# Patient Record
Sex: Female | Born: 1950 | Race: White | Hispanic: No | Marital: Single | Smoking: Never smoker
Health system: Southern US, Community
[De-identification: ages and names within clinical notes are randomized; demographics above are authoritative.]

## PROBLEM LIST (undated history)

## (undated) DIAGNOSIS — F32A Depression, unspecified: Secondary | ICD-10-CM

## (undated) DIAGNOSIS — K219 Gastro-esophageal reflux disease without esophagitis: Secondary | ICD-10-CM

## (undated) DIAGNOSIS — R011 Cardiac murmur, unspecified: Secondary | ICD-10-CM

## (undated) DIAGNOSIS — F419 Anxiety disorder, unspecified: Secondary | ICD-10-CM

## (undated) DIAGNOSIS — D510 Vitamin B12 deficiency anemia due to intrinsic factor deficiency: Secondary | ICD-10-CM

## (undated) DIAGNOSIS — I10 Essential (primary) hypertension: Secondary | ICD-10-CM

## (undated) DIAGNOSIS — M549 Dorsalgia, unspecified: Secondary | ICD-10-CM

## (undated) DIAGNOSIS — E785 Hyperlipidemia, unspecified: Secondary | ICD-10-CM

## (undated) DIAGNOSIS — H269 Unspecified cataract: Secondary | ICD-10-CM

## (undated) DIAGNOSIS — M1990 Unspecified osteoarthritis, unspecified site: Secondary | ICD-10-CM

## (undated) HISTORY — DX: Anxiety disorder, unspecified: F41.9

## (undated) HISTORY — DX: Dorsalgia, unspecified: M54.9

## (undated) HISTORY — DX: Depression, unspecified: F32.A

## (undated) HISTORY — DX: Gastro-esophageal reflux disease without esophagitis: K21.9

## (undated) HISTORY — PX: TONSILLECTOMY: SUR1361

## (undated) HISTORY — DX: Unspecified cataract: H26.9

## (undated) HISTORY — DX: Unspecified osteoarthritis, unspecified site: M19.90

## (undated) HISTORY — PX: HEMORRHOID BANDING: SHX5850

## (undated) HISTORY — PX: APPENDECTOMY: SHX54

## (undated) HISTORY — DX: Cardiac murmur, unspecified: R01.1

## (undated) HISTORY — PX: WISDOM TOOTH EXTRACTION: SHX21

---

## 1998-07-09 ENCOUNTER — Other Ambulatory Visit: Admission: RE | Admit: 1998-07-09 | Discharge: 1998-07-09 | Payer: Self-pay | Admitting: Obstetrics and Gynecology

## 1999-10-14 ENCOUNTER — Other Ambulatory Visit: Admission: RE | Admit: 1999-10-14 | Discharge: 1999-10-14 | Payer: Self-pay | Admitting: Gynecology

## 1999-11-10 ENCOUNTER — Ambulatory Visit (HOSPITAL_COMMUNITY): Admission: RE | Admit: 1999-11-10 | Discharge: 1999-11-10 | Payer: Self-pay | Admitting: Gynecology

## 2001-04-20 ENCOUNTER — Other Ambulatory Visit: Admission: RE | Admit: 2001-04-20 | Discharge: 2001-04-20 | Payer: Self-pay | Admitting: Gynecology

## 2005-09-28 ENCOUNTER — Other Ambulatory Visit: Admission: RE | Admit: 2005-09-28 | Discharge: 2005-09-28 | Payer: Self-pay | Admitting: Gynecology

## 2006-10-01 ENCOUNTER — Other Ambulatory Visit: Admission: RE | Admit: 2006-10-01 | Discharge: 2006-10-01 | Payer: Self-pay | Admitting: Obstetrics and Gynecology

## 2009-03-08 ENCOUNTER — Other Ambulatory Visit: Admission: RE | Admit: 2009-03-08 | Discharge: 2009-03-08 | Payer: Self-pay | Admitting: Obstetrics and Gynecology

## 2009-03-08 ENCOUNTER — Ambulatory Visit: Payer: Self-pay | Admitting: Women's Health

## 2010-07-02 ENCOUNTER — Encounter: Payer: Self-pay | Admitting: Women's Health

## 2011-10-28 ENCOUNTER — Emergency Department (HOSPITAL_COMMUNITY)
Admission: EM | Admit: 2011-10-28 | Discharge: 2011-10-29 | Disposition: A | Discharge status: Home / Self Care | Payer: BC Managed Care – PPO | Attending: Emergency Medicine | Admitting: Emergency Medicine

## 2011-10-28 ENCOUNTER — Encounter (HOSPITAL_COMMUNITY): Payer: Self-pay | Admitting: Emergency Medicine

## 2011-10-28 DIAGNOSIS — S31809A Unspecified open wound of unspecified buttock, initial encounter: Secondary | ICD-10-CM | POA: Insufficient documentation

## 2011-10-28 DIAGNOSIS — T148XXA Other injury of unspecified body region, initial encounter: Secondary | ICD-10-CM

## 2011-10-28 DIAGNOSIS — W460XXA Contact with hypodermic needle, initial encounter: Secondary | ICD-10-CM | POA: Insufficient documentation

## 2011-10-28 HISTORY — DX: Vitamin B12 deficiency anemia due to intrinsic factor deficiency: D51.0

## 2011-10-28 HISTORY — DX: Hyperlipidemia, unspecified: E78.5

## 2011-10-28 NOTE — ED Notes (Signed)
Pt presents w/ needle lodged in RUOQ of right buttock from self administration of B12 shot earlier tonight, needle broke from syringe

## 2011-10-29 NOTE — ED Notes (Signed)
Discharge instructions reviewed w/ pt., verbalizes understanding. No prescriptions proivded at discharge.

## 2011-10-29 NOTE — ED Provider Notes (Signed)
Medical screening examination/treatment/procedure(s) were performed by non-physician practitioner and as supervising physician I was immediately available for consultation/collaboration.   Hanley Seamen, MD 10/29/11 [phone]

## 2011-10-29 NOTE — ED Provider Notes (Signed)
History     CSN: [id number]  Arrival date & time 10/28/11  2219   First MD Initiated Contact with Patient 10/28/11 2326      Chief Complaint  Patient presents with  . Foreign Body in Skin    (Consider location/radiation/quality/duration/timing/severity/associated sxs/prior treatment) HPI Comments: Patient presents today with the chief complaint of a needle getting stuck in her right thigh/buttock. She was giving herself her B12 injection in her right buttock and when she pulled the needle out she realized it was no longer attached to the syringe. She did not see the needle on the floor or hear it drop. She reports no pain even with palpation. She has never had anything like this happen before. Onset acute. Course constant. Nothing makes symptoms better or worse.   The history is provided by the patient.    Past Medical History  Diagnosis Date  . Pernicious anemia   . Hyperlipidemia     Past Surgical History  Procedure Date  . Tonsillectomy   . Appendectomy     No family history on file.  History  Substance Use Topics  . Smoking status: Never Smoker   . Smokeless tobacco: Never Used  . Alcohol Use: Yes     ocassional wine    OB History    Grav Para Term Preterm Abortions TAB SAB Ect Mult Living                  Review of Systems  Musculoskeletal:       No pain in her right buttock  Skin: Positive for wound (puncture). Negative for color change.  Neurological: Negative for weakness and numbness.    Allergies  Review of patient's allergies indicates no known allergies.  Home Medications   Current Outpatient Rx  Name Route Sig Dispense Refill  . B COMPLEX-C PO TABS Oral Take 1 tablet by mouth daily.    Marland Kitchen CALCIUM CARBONATE 1250 MG PO TABS Oral Take 2 tablets by mouth daily.    Marland Kitchen VITAMIN D 1000 UNITS PO TABS Oral Take 2,000 Units by mouth daily.    Marland Kitchen FLUOXETINE HCL 10 MG PO TABS Oral Take 10 mg by mouth daily.    Marland Kitchen OVER THE COUNTER MEDICATION Oral Take 1  tablet by mouth daily. Plant based to lower chlolesterol    . OVER THE COUNTER MEDICATION Oral Take by mouth daily. tumeric    . OVER THE COUNTER MEDICATION Oral Take by mouth daily. Emercency=1 packet per day    . OVER THE COUNTER MEDICATION  daily. Cartiledge=for joint pain    . SIMVASTATIN 10 MG PO TABS Oral Take 10 mg by mouth at bedtime.    Marland Kitchen VITAMIN E 400 UNITS PO CAPS Oral Take 400 Units by mouth daily.      BP 149/69  Pulse 74  Temp 98.4 F (36.9 C) (Oral)  SpO2 100%  Physical Exam  Nursing note and vitals reviewed. Constitutional: She appears well-developed and well-nourished.       In no acute distress  HENT:  Head: Normocephalic and atraumatic.  Eyes: Conjunctivae normal are normal.  Neck: Normal range of motion.  Pulmonary/Chest: No respiratory distress.  Neurological: She is alert.  Skin: Skin is warm and dry.       Small puncture noted at site of injection of R lateral buttock. No resistance with palpation of area. No visible needle or metal.   Psychiatric: She has a normal mood and affect.    ED Course  Procedures (including critical care time)  Labs Reviewed - No data to display No results found.   1. Puncture wound     12:15 AM Patient seen and examined.    Vital signs reviewed and are as follows: Filed Vitals:   10/28/11 2309  BP: 149/69  Pulse: 74  Temp: 98.4 F (36.9 C)   Ultrasound was used on the area and no metallic shadow was seen. Palpation of the area did not reveal any resistance suggestive of needle in skin. Syringe was disassembled and the complete needle was found pushed into the syringe behind a spring. Patient confirmed that this is the needle that she was concerned about.  Patient was concerned about not getting a complete dose of her B12 shot. I urged patient to call her primary care physician tomorrow regarding this.   MDM  Patient presents with concern for needle buried within her skin after self administration of the B12  injection. Needle was found within the syringe itself. Patient does not have foreign body.       Renne Crigler, [place] 10/29/11 [phone]

## 2012-02-03 ENCOUNTER — Ambulatory Visit: Payer: BC Managed Care – PPO | Admitting: Internal Medicine

## 2012-07-21 ENCOUNTER — Other Ambulatory Visit: Payer: Self-pay | Admitting: Family Medicine

## 2012-07-21 DIAGNOSIS — R748 Abnormal levels of other serum enzymes: Secondary | ICD-10-CM

## 2012-07-28 ENCOUNTER — Ambulatory Visit
Admission: RE | Admit: 2012-07-28 | Discharge: 2012-07-28 | Disposition: A | Discharge status: Home / Self Care | Payer: BC Managed Care – PPO | Source: Ambulatory Visit | Attending: Family Medicine | Admitting: Family Medicine

## 2012-07-28 DIAGNOSIS — R748 Abnormal levels of other serum enzymes: Secondary | ICD-10-CM

## 2012-10-10 ENCOUNTER — Ambulatory Visit: Payer: BC Managed Care – PPO | Admitting: Cardiovascular Disease

## 2013-01-31 ENCOUNTER — Emergency Department (HOSPITAL_BASED_OUTPATIENT_CLINIC_OR_DEPARTMENT_OTHER)
Admission: EM | Admit: 2013-01-31 | Discharge: 2013-01-31 | Disposition: A | Discharge status: Home / Self Care | Payer: BC Managed Care – PPO | Attending: Emergency Medicine | Admitting: Emergency Medicine

## 2013-01-31 ENCOUNTER — Encounter (HOSPITAL_BASED_OUTPATIENT_CLINIC_OR_DEPARTMENT_OTHER): Payer: Self-pay | Admitting: Emergency Medicine

## 2013-01-31 DIAGNOSIS — W1809XA Striking against other object with subsequent fall, initial encounter: Secondary | ICD-10-CM | POA: Insufficient documentation

## 2013-01-31 DIAGNOSIS — Y929 Unspecified place or not applicable: Secondary | ICD-10-CM | POA: Insufficient documentation

## 2013-01-31 DIAGNOSIS — E785 Hyperlipidemia, unspecified: Secondary | ICD-10-CM | POA: Insufficient documentation

## 2013-01-31 DIAGNOSIS — Z23 Encounter for immunization: Secondary | ICD-10-CM | POA: Insufficient documentation

## 2013-01-31 DIAGNOSIS — Z862 Personal history of diseases of the blood and blood-forming organs and certain disorders involving the immune mechanism: Secondary | ICD-10-CM | POA: Insufficient documentation

## 2013-01-31 DIAGNOSIS — S01501A Unspecified open wound of lip, initial encounter: Secondary | ICD-10-CM | POA: Insufficient documentation

## 2013-01-31 DIAGNOSIS — Y93K1 Activity, walking an animal: Secondary | ICD-10-CM | POA: Insufficient documentation

## 2013-01-31 DIAGNOSIS — Z79899 Other long term (current) drug therapy: Secondary | ICD-10-CM | POA: Insufficient documentation

## 2013-01-31 DIAGNOSIS — W010XXA Fall on same level from slipping, tripping and stumbling without subsequent striking against object, initial encounter: Secondary | ICD-10-CM | POA: Insufficient documentation

## 2013-01-31 DIAGNOSIS — S01511A Laceration without foreign body of lip, initial encounter: Secondary | ICD-10-CM

## 2013-01-31 MED ORDER — TETANUS-DIPHTH-ACELL PERTUSSIS 5-2.5-18.5 LF-MCG/0.5 IM SUSP
0.5000 mL | Freq: Once | INTRAMUSCULAR | Status: AC
  Administered 2013-01-31: 0.5 mL via INTRAMUSCULAR
  Filled 2013-01-31: qty 0.5

## 2013-01-31 MED ORDER — TETANUS-DIPHTH-ACELL PERTUSSIS 5-2.5-18.5 LF-MCG/0.5 IM SUSP
INTRAMUSCULAR | Status: AC
  Filled 2013-01-31: qty 0.5

## 2013-01-31 MED ORDER — PENICILLIN V POTASSIUM 500 MG PO TABS: 500.0000 mg | ORAL_TABLET | Freq: Four times a day (QID) | ORAL | Status: AC

## 2013-01-31 NOTE — ED Notes (Signed)
Pt amb to triage with quick steady gait in nad. Pt reports trip and fall while walking her dog just pta. Pt reports hitting her face on concrete, abrasions noted to face and mouth, denies any loc.

## 2013-01-31 NOTE — ED Provider Notes (Signed)
Medical screening examination/treatment/procedure(s) were performed by non-physician practitioner and as supervising physician I was immediately available for consultation/collaboration.  EKG Interpretation   None         Fradel Baldonado, MD 01/31/13 2252 

## 2013-01-31 NOTE — ED Provider Notes (Signed)
CSN: [id number]     Arrival date & time 01/31/13  1835 History   First MD Initiated Contact with Patient 01/31/13 1947     Chief Complaint  Patient presents with  . Fall   (Consider location/radiation/quality/duration/timing/severity/associated sxs/prior Treatment) Patient is a 62 y.o. female presenting with fall. The history is provided by the patient. No language interpreter was used.  Fall This is a new problem. The current episode started today. The problem occurs constantly. The problem has been unchanged. Nothing aggravates the symptoms. She has tried nothing for the symptoms.  Pt fell and hit face on concrete.  Pt complains of a cut in  her mouth.  No loss of consciousness.  No vomitting.  Pt complains of a slight headache  Past Medical History  Diagnosis Date  . Pernicious anemia   . Hyperlipidemia    Past Surgical History  Procedure Laterality Date  . Tonsillectomy    . Appendectomy     History reviewed. No pertinent family history. History  Substance Use Topics  . Smoking status: Never Smoker   . Smokeless tobacco: Never Used  . Alcohol Use: Yes     Comment: ocassional wine   OB History   Grav Para Term Preterm Abortions TAB SAB Ect Mult Living                 Review of Systems  All other systems reviewed and are negative.    Allergies  Review of patient's allergies indicates no known allergies.  Home Medications   Current Outpatient Rx  Name  Route  Sig  Dispense  Refill  . B Complex-C (B-COMPLEX WITH VITAMIN C) tablet   Oral   Take 1 tablet by mouth daily.         . calcium carbonate (CALCIUM 500) 1250 MG tablet   Oral   Take 2 tablets by mouth daily.         . cholecalciferol (VITAMIN D) 1000 UNITS tablet   Oral   Take 2,000 Units by mouth daily.         Marland Kitchen FLUoxetine (PROZAC) 10 MG tablet   Oral   Take 10 mg by mouth daily.         Marland Kitchen OVER THE COUNTER MEDICATION   Oral   Take 1 tablet by mouth daily. Plant based to lower  chlolesterol         . OVER THE COUNTER MEDICATION   Oral   Take by mouth daily. tumeric         . OVER THE COUNTER MEDICATION   Oral   Take by mouth daily. Emercency=1 packet per day         . OVER THE COUNTER MEDICATION      daily. Cartiledge=for joint pain         . penicillin v potassium (VEETID) 500 MG tablet   Oral   Take 1 tablet (500 mg total) by mouth 4 (four) times daily.   20 tablet   0   . simvastatin (ZOCOR) 10 MG tablet   Oral   Take 10 mg by mouth at bedtime.         . vitamin E 400 UNIT capsule   Oral   Take 400 Units by mouth daily.          BP 158/84  Pulse 74  Temp(Src) 99.1 F (37.3 C) (Oral)  Resp 18  Ht 5' 3.5" (1.613 m)  Wt 155 lb (70.308 kg)  BMI 27.02 kg/m2  SpO2 99% Physical Exam  Vitals reviewed. Constitutional: She is oriented to person, place, and time. She appears well-developed and well-nourished.  HENT:  Head: Normocephalic and atraumatic.  Right Ear: External ear normal.  Left Ear: External ear normal.  Laceration upper inner lip,   No laceration outside,  Superficial abrasions   Eyes: Conjunctivae are normal. Pupils are equal, round, and reactive to light.  Neck: Normal range of motion. Neck supple.  Cardiovascular: Normal rate and normal heart sounds.   Pulmonary/Chest: Effort normal.  Abdominal: Soft.  Neurological: She is alert and oriented to person, place, and time. She has normal reflexes.  Psychiatric: She has a normal mood and affect.    ED Course  Procedures (including critical care time) Labs Review Labs Reviewed - No data to display Imaging Review No results found.  EKG Interpretation   None       MDM   1. Laceration of lip, initial encounter    pcn vk 500mg  qid  Tylenol,  Tetanus updated    Elson Areas, PA-C 01/31/13 2122

## 2013-11-23 DIAGNOSIS — Z23 Encounter for immunization: Secondary | ICD-10-CM | POA: Diagnosis not present

## 2014-01-18 DIAGNOSIS — M549 Dorsalgia, unspecified: Secondary | ICD-10-CM | POA: Diagnosis not present

## 2014-01-18 DIAGNOSIS — F329 Major depressive disorder, single episode, unspecified: Secondary | ICD-10-CM | POA: Diagnosis not present

## 2014-01-18 DIAGNOSIS — F419 Anxiety disorder, unspecified: Secondary | ICD-10-CM | POA: Diagnosis not present

## 2014-01-18 DIAGNOSIS — D510 Vitamin B12 deficiency anemia due to intrinsic factor deficiency: Secondary | ICD-10-CM | POA: Diagnosis not present

## 2014-03-26 DIAGNOSIS — F419 Anxiety disorder, unspecified: Secondary | ICD-10-CM | POA: Diagnosis not present

## 2014-03-26 DIAGNOSIS — R5383 Other fatigue: Secondary | ICD-10-CM | POA: Diagnosis not present

## 2014-03-26 DIAGNOSIS — D510 Vitamin B12 deficiency anemia due to intrinsic factor deficiency: Secondary | ICD-10-CM | POA: Diagnosis not present

## 2014-03-26 DIAGNOSIS — M542 Cervicalgia: Secondary | ICD-10-CM | POA: Diagnosis not present

## 2014-03-26 DIAGNOSIS — E559 Vitamin D deficiency, unspecified: Secondary | ICD-10-CM | POA: Diagnosis not present

## 2014-03-26 DIAGNOSIS — F329 Major depressive disorder, single episode, unspecified: Secondary | ICD-10-CM | POA: Diagnosis not present

## 2014-04-23 DIAGNOSIS — E785 Hyperlipidemia, unspecified: Secondary | ICD-10-CM | POA: Diagnosis not present

## 2014-04-23 DIAGNOSIS — F411 Generalized anxiety disorder: Secondary | ICD-10-CM | POA: Diagnosis not present

## 2014-04-23 DIAGNOSIS — Z131 Encounter for screening for diabetes mellitus: Secondary | ICD-10-CM | POA: Diagnosis not present

## 2014-04-23 DIAGNOSIS — F329 Major depressive disorder, single episode, unspecified: Secondary | ICD-10-CM | POA: Diagnosis not present

## 2014-04-23 DIAGNOSIS — R5383 Other fatigue: Secondary | ICD-10-CM | POA: Diagnosis not present

## 2014-07-18 DIAGNOSIS — M5136 Other intervertebral disc degeneration, lumbar region: Secondary | ICD-10-CM | POA: Diagnosis not present

## 2014-07-18 DIAGNOSIS — M5030 Other cervical disc degeneration, unspecified cervical region: Secondary | ICD-10-CM | POA: Diagnosis not present

## 2014-07-18 DIAGNOSIS — M9902 Segmental and somatic dysfunction of thoracic region: Secondary | ICD-10-CM | POA: Diagnosis not present

## 2014-07-18 DIAGNOSIS — M9901 Segmental and somatic dysfunction of cervical region: Secondary | ICD-10-CM | POA: Diagnosis not present

## 2014-07-18 DIAGNOSIS — M9903 Segmental and somatic dysfunction of lumbar region: Secondary | ICD-10-CM | POA: Diagnosis not present

## 2014-07-18 DIAGNOSIS — M9905 Segmental and somatic dysfunction of pelvic region: Secondary | ICD-10-CM | POA: Diagnosis not present

## 2014-10-01 DIAGNOSIS — M5136 Other intervertebral disc degeneration, lumbar region: Secondary | ICD-10-CM | POA: Diagnosis not present

## 2014-10-01 DIAGNOSIS — M9901 Segmental and somatic dysfunction of cervical region: Secondary | ICD-10-CM | POA: Diagnosis not present

## 2014-10-01 DIAGNOSIS — M9904 Segmental and somatic dysfunction of sacral region: Secondary | ICD-10-CM | POA: Diagnosis not present

## 2014-10-01 DIAGNOSIS — M9903 Segmental and somatic dysfunction of lumbar region: Secondary | ICD-10-CM | POA: Diagnosis not present

## 2014-10-08 DIAGNOSIS — M9904 Segmental and somatic dysfunction of sacral region: Secondary | ICD-10-CM | POA: Diagnosis not present

## 2014-10-08 DIAGNOSIS — M5136 Other intervertebral disc degeneration, lumbar region: Secondary | ICD-10-CM | POA: Diagnosis not present

## 2014-10-08 DIAGNOSIS — M9903 Segmental and somatic dysfunction of lumbar region: Secondary | ICD-10-CM | POA: Diagnosis not present

## 2014-10-08 DIAGNOSIS — M9901 Segmental and somatic dysfunction of cervical region: Secondary | ICD-10-CM | POA: Diagnosis not present

## 2014-10-25 ENCOUNTER — Other Ambulatory Visit: Payer: Self-pay | Admitting: Family Medicine

## 2014-10-25 DIAGNOSIS — Z23 Encounter for immunization: Secondary | ICD-10-CM | POA: Diagnosis not present

## 2014-10-25 DIAGNOSIS — Z0000 Encounter for general adult medical examination without abnormal findings: Secondary | ICD-10-CM | POA: Diagnosis not present

## 2014-10-25 DIAGNOSIS — E785 Hyperlipidemia, unspecified: Secondary | ICD-10-CM | POA: Diagnosis not present

## 2014-10-25 DIAGNOSIS — E559 Vitamin D deficiency, unspecified: Secondary | ICD-10-CM

## 2014-10-25 DIAGNOSIS — Z1231 Encounter for screening mammogram for malignant neoplasm of breast: Secondary | ICD-10-CM

## 2014-10-25 DIAGNOSIS — F411 Generalized anxiety disorder: Secondary | ICD-10-CM | POA: Diagnosis not present

## 2014-10-25 DIAGNOSIS — R5383 Other fatigue: Secondary | ICD-10-CM | POA: Diagnosis not present

## 2014-10-25 DIAGNOSIS — R739 Hyperglycemia, unspecified: Secondary | ICD-10-CM | POA: Diagnosis not present

## 2014-10-25 DIAGNOSIS — Z1211 Encounter for screening for malignant neoplasm of colon: Secondary | ICD-10-CM | POA: Diagnosis not present

## 2014-10-25 DIAGNOSIS — D510 Vitamin B12 deficiency anemia due to intrinsic factor deficiency: Secondary | ICD-10-CM | POA: Diagnosis not present

## 2014-10-25 DIAGNOSIS — E539 Vitamin B deficiency, unspecified: Secondary | ICD-10-CM | POA: Diagnosis not present

## 2014-10-31 ENCOUNTER — Other Ambulatory Visit: Payer: Self-pay

## 2014-10-31 ENCOUNTER — Ambulatory Visit: Payer: Self-pay

## 2014-11-12 DIAGNOSIS — M9903 Segmental and somatic dysfunction of lumbar region: Secondary | ICD-10-CM | POA: Diagnosis not present

## 2014-11-12 DIAGNOSIS — M9901 Segmental and somatic dysfunction of cervical region: Secondary | ICD-10-CM | POA: Diagnosis not present

## 2014-11-12 DIAGNOSIS — M9904 Segmental and somatic dysfunction of sacral region: Secondary | ICD-10-CM | POA: Diagnosis not present

## 2014-11-12 DIAGNOSIS — M5136 Other intervertebral disc degeneration, lumbar region: Secondary | ICD-10-CM | POA: Diagnosis not present

## 2014-11-14 DIAGNOSIS — M9903 Segmental and somatic dysfunction of lumbar region: Secondary | ICD-10-CM | POA: Diagnosis not present

## 2014-11-14 DIAGNOSIS — M9904 Segmental and somatic dysfunction of sacral region: Secondary | ICD-10-CM | POA: Diagnosis not present

## 2014-11-14 DIAGNOSIS — M9901 Segmental and somatic dysfunction of cervical region: Secondary | ICD-10-CM | POA: Diagnosis not present

## 2014-11-14 DIAGNOSIS — M5136 Other intervertebral disc degeneration, lumbar region: Secondary | ICD-10-CM | POA: Diagnosis not present

## 2014-11-19 DIAGNOSIS — M9903 Segmental and somatic dysfunction of lumbar region: Secondary | ICD-10-CM | POA: Diagnosis not present

## 2014-11-19 DIAGNOSIS — M5136 Other intervertebral disc degeneration, lumbar region: Secondary | ICD-10-CM | POA: Diagnosis not present

## 2014-11-19 DIAGNOSIS — M9904 Segmental and somatic dysfunction of sacral region: Secondary | ICD-10-CM | POA: Diagnosis not present

## 2014-11-19 DIAGNOSIS — M9901 Segmental and somatic dysfunction of cervical region: Secondary | ICD-10-CM | POA: Diagnosis not present

## 2014-11-20 DIAGNOSIS — M9901 Segmental and somatic dysfunction of cervical region: Secondary | ICD-10-CM | POA: Diagnosis not present

## 2014-11-20 DIAGNOSIS — M5136 Other intervertebral disc degeneration, lumbar region: Secondary | ICD-10-CM | POA: Diagnosis not present

## 2014-11-20 DIAGNOSIS — M9903 Segmental and somatic dysfunction of lumbar region: Secondary | ICD-10-CM | POA: Diagnosis not present

## 2014-11-20 DIAGNOSIS — M9904 Segmental and somatic dysfunction of sacral region: Secondary | ICD-10-CM | POA: Diagnosis not present

## 2014-11-23 DIAGNOSIS — M5136 Other intervertebral disc degeneration, lumbar region: Secondary | ICD-10-CM | POA: Diagnosis not present

## 2014-11-23 DIAGNOSIS — M9903 Segmental and somatic dysfunction of lumbar region: Secondary | ICD-10-CM | POA: Diagnosis not present

## 2014-11-23 DIAGNOSIS — M9901 Segmental and somatic dysfunction of cervical region: Secondary | ICD-10-CM | POA: Diagnosis not present

## 2014-11-23 DIAGNOSIS — M9904 Segmental and somatic dysfunction of sacral region: Secondary | ICD-10-CM | POA: Diagnosis not present

## 2014-12-03 DIAGNOSIS — M9903 Segmental and somatic dysfunction of lumbar region: Secondary | ICD-10-CM | POA: Diagnosis not present

## 2014-12-03 DIAGNOSIS — M9901 Segmental and somatic dysfunction of cervical region: Secondary | ICD-10-CM | POA: Diagnosis not present

## 2014-12-03 DIAGNOSIS — M9904 Segmental and somatic dysfunction of sacral region: Secondary | ICD-10-CM | POA: Diagnosis not present

## 2014-12-03 DIAGNOSIS — M5136 Other intervertebral disc degeneration, lumbar region: Secondary | ICD-10-CM | POA: Diagnosis not present

## 2014-12-10 DIAGNOSIS — M9901 Segmental and somatic dysfunction of cervical region: Secondary | ICD-10-CM | POA: Diagnosis not present

## 2014-12-10 DIAGNOSIS — M9903 Segmental and somatic dysfunction of lumbar region: Secondary | ICD-10-CM | POA: Diagnosis not present

## 2014-12-10 DIAGNOSIS — M5136 Other intervertebral disc degeneration, lumbar region: Secondary | ICD-10-CM | POA: Diagnosis not present

## 2014-12-10 DIAGNOSIS — M9904 Segmental and somatic dysfunction of sacral region: Secondary | ICD-10-CM | POA: Diagnosis not present

## 2014-12-17 DIAGNOSIS — M9901 Segmental and somatic dysfunction of cervical region: Secondary | ICD-10-CM | POA: Diagnosis not present

## 2014-12-17 DIAGNOSIS — M9904 Segmental and somatic dysfunction of sacral region: Secondary | ICD-10-CM | POA: Diagnosis not present

## 2014-12-17 DIAGNOSIS — M5136 Other intervertebral disc degeneration, lumbar region: Secondary | ICD-10-CM | POA: Diagnosis not present

## 2014-12-17 DIAGNOSIS — M9903 Segmental and somatic dysfunction of lumbar region: Secondary | ICD-10-CM | POA: Diagnosis not present

## 2015-01-24 DIAGNOSIS — E559 Vitamin D deficiency, unspecified: Secondary | ICD-10-CM | POA: Diagnosis not present

## 2015-01-24 DIAGNOSIS — R749 Abnormal serum enzyme level, unspecified: Secondary | ICD-10-CM | POA: Diagnosis not present

## 2015-01-28 DIAGNOSIS — M549 Dorsalgia, unspecified: Secondary | ICD-10-CM | POA: Diagnosis not present

## 2015-01-28 DIAGNOSIS — E782 Mixed hyperlipidemia: Secondary | ICD-10-CM | POA: Diagnosis not present

## 2015-01-28 DIAGNOSIS — F329 Major depressive disorder, single episode, unspecified: Secondary | ICD-10-CM | POA: Diagnosis not present

## 2015-01-28 DIAGNOSIS — R748 Abnormal levels of other serum enzymes: Secondary | ICD-10-CM | POA: Diagnosis not present

## 2015-05-09 ENCOUNTER — Other Ambulatory Visit: Payer: Self-pay | Admitting: Family Medicine

## 2015-05-09 ENCOUNTER — Ambulatory Visit
Admission: RE | Admit: 2015-05-09 | Discharge: 2015-05-09 | Disposition: A | Discharge status: Home / Self Care | Payer: Medicare Other | Source: Ambulatory Visit | Attending: Family Medicine | Admitting: Family Medicine

## 2015-05-09 DIAGNOSIS — R52 Pain, unspecified: Secondary | ICD-10-CM

## 2015-05-09 DIAGNOSIS — R531 Weakness: Secondary | ICD-10-CM

## 2015-05-09 DIAGNOSIS — M5136 Other intervertebral disc degeneration, lumbar region: Secondary | ICD-10-CM | POA: Diagnosis not present

## 2015-05-09 DIAGNOSIS — M25551 Pain in right hip: Secondary | ICD-10-CM | POA: Diagnosis not present

## 2015-05-16 DIAGNOSIS — H2513 Age-related nuclear cataract, bilateral: Secondary | ICD-10-CM | POA: Diagnosis not present

## 2015-05-16 DIAGNOSIS — H5213 Myopia, bilateral: Secondary | ICD-10-CM | POA: Diagnosis not present

## 2015-05-16 DIAGNOSIS — H524 Presbyopia: Secondary | ICD-10-CM | POA: Diagnosis not present

## 2015-05-16 DIAGNOSIS — H52222 Regular astigmatism, left eye: Secondary | ICD-10-CM | POA: Diagnosis not present

## 2015-08-07 DIAGNOSIS — R195 Other fecal abnormalities: Secondary | ICD-10-CM | POA: Diagnosis not present

## 2015-10-28 DIAGNOSIS — E782 Mixed hyperlipidemia: Secondary | ICD-10-CM | POA: Diagnosis not present

## 2015-10-28 DIAGNOSIS — R749 Abnormal serum enzyme level, unspecified: Secondary | ICD-10-CM | POA: Diagnosis not present

## 2015-10-28 DIAGNOSIS — R748 Abnormal levels of other serum enzymes: Secondary | ICD-10-CM | POA: Diagnosis not present

## 2015-10-28 DIAGNOSIS — E785 Hyperlipidemia, unspecified: Secondary | ICD-10-CM | POA: Diagnosis not present

## 2015-10-28 DIAGNOSIS — Z136 Encounter for screening for cardiovascular disorders: Secondary | ICD-10-CM | POA: Diagnosis not present

## 2015-10-28 DIAGNOSIS — E559 Vitamin D deficiency, unspecified: Secondary | ICD-10-CM | POA: Diagnosis not present

## 2015-10-31 DIAGNOSIS — Z23 Encounter for immunization: Secondary | ICD-10-CM | POA: Diagnosis not present

## 2015-10-31 DIAGNOSIS — F329 Major depressive disorder, single episode, unspecified: Secondary | ICD-10-CM | POA: Diagnosis not present

## 2015-10-31 DIAGNOSIS — Z0000 Encounter for general adult medical examination without abnormal findings: Secondary | ICD-10-CM | POA: Diagnosis not present

## 2015-10-31 DIAGNOSIS — E559 Vitamin D deficiency, unspecified: Secondary | ICD-10-CM | POA: Diagnosis not present

## 2015-10-31 DIAGNOSIS — R749 Abnormal serum enzyme level, unspecified: Secondary | ICD-10-CM | POA: Diagnosis not present

## 2015-10-31 DIAGNOSIS — R739 Hyperglycemia, unspecified: Secondary | ICD-10-CM | POA: Diagnosis not present

## 2015-10-31 DIAGNOSIS — Z1211 Encounter for screening for malignant neoplasm of colon: Secondary | ICD-10-CM | POA: Diagnosis not present

## 2015-10-31 DIAGNOSIS — E782 Mixed hyperlipidemia: Secondary | ICD-10-CM | POA: Diagnosis not present

## 2015-12-26 DIAGNOSIS — K641 Second degree hemorrhoids: Secondary | ICD-10-CM | POA: Diagnosis not present

## 2015-12-26 DIAGNOSIS — K648 Other hemorrhoids: Secondary | ICD-10-CM | POA: Diagnosis not present

## 2015-12-26 DIAGNOSIS — Z1211 Encounter for screening for malignant neoplasm of colon: Secondary | ICD-10-CM | POA: Diagnosis not present

## 2015-12-26 LAB — HM COLONOSCOPY

## 2016-01-06 DIAGNOSIS — Z136 Encounter for screening for cardiovascular disorders: Secondary | ICD-10-CM | POA: Diagnosis not present

## 2016-01-13 DIAGNOSIS — F419 Anxiety disorder, unspecified: Secondary | ICD-10-CM | POA: Diagnosis not present

## 2016-01-13 DIAGNOSIS — E785 Hyperlipidemia, unspecified: Secondary | ICD-10-CM | POA: Diagnosis not present

## 2016-01-13 DIAGNOSIS — F329 Major depressive disorder, single episode, unspecified: Secondary | ICD-10-CM | POA: Diagnosis not present

## 2016-01-13 DIAGNOSIS — Z6826 Body mass index (BMI) 26.0-26.9, adult: Secondary | ICD-10-CM | POA: Diagnosis not present

## 2016-07-20 DIAGNOSIS — E782 Mixed hyperlipidemia: Secondary | ICD-10-CM | POA: Diagnosis not present

## 2016-07-20 DIAGNOSIS — Z136 Encounter for screening for cardiovascular disorders: Secondary | ICD-10-CM | POA: Diagnosis not present

## 2016-07-20 DIAGNOSIS — E559 Vitamin D deficiency, unspecified: Secondary | ICD-10-CM | POA: Diagnosis not present

## 2016-07-23 DIAGNOSIS — Z6826 Body mass index (BMI) 26.0-26.9, adult: Secondary | ICD-10-CM | POA: Diagnosis not present

## 2016-07-23 DIAGNOSIS — E785 Hyperlipidemia, unspecified: Secondary | ICD-10-CM | POA: Diagnosis not present

## 2016-07-23 DIAGNOSIS — E559 Vitamin D deficiency, unspecified: Secondary | ICD-10-CM | POA: Diagnosis not present

## 2016-07-23 DIAGNOSIS — F329 Major depressive disorder, single episode, unspecified: Secondary | ICD-10-CM | POA: Diagnosis not present

## 2016-11-04 ENCOUNTER — Other Ambulatory Visit: Payer: Self-pay | Admitting: Family Medicine

## 2016-11-04 DIAGNOSIS — Z1211 Encounter for screening for malignant neoplasm of colon: Secondary | ICD-10-CM | POA: Diagnosis not present

## 2016-11-04 DIAGNOSIS — Z1231 Encounter for screening mammogram for malignant neoplasm of breast: Secondary | ICD-10-CM

## 2016-11-04 DIAGNOSIS — Z0000 Encounter for general adult medical examination without abnormal findings: Secondary | ICD-10-CM | POA: Diagnosis not present

## 2016-11-04 DIAGNOSIS — F331 Major depressive disorder, recurrent, moderate: Secondary | ICD-10-CM | POA: Diagnosis not present

## 2016-11-04 DIAGNOSIS — Z6826 Body mass index (BMI) 26.0-26.9, adult: Secondary | ICD-10-CM | POA: Diagnosis not present

## 2016-11-04 DIAGNOSIS — Z23 Encounter for immunization: Secondary | ICD-10-CM | POA: Diagnosis not present

## 2016-11-04 DIAGNOSIS — F411 Generalized anxiety disorder: Secondary | ICD-10-CM | POA: Diagnosis not present

## 2016-11-19 ENCOUNTER — Ambulatory Visit: Payer: Medicare Other

## 2016-12-31 ENCOUNTER — Ambulatory Visit: Payer: Medicare Other

## 2017-01-27 DIAGNOSIS — K648 Other hemorrhoids: Secondary | ICD-10-CM | POA: Diagnosis not present

## 2017-02-04 ENCOUNTER — Ambulatory Visit: Payer: Medicare Other

## 2017-03-03 DIAGNOSIS — K648 Other hemorrhoids: Secondary | ICD-10-CM | POA: Diagnosis not present

## 2017-03-16 DIAGNOSIS — F331 Major depressive disorder, recurrent, moderate: Secondary | ICD-10-CM | POA: Diagnosis not present

## 2017-03-16 DIAGNOSIS — Z6827 Body mass index (BMI) 27.0-27.9, adult: Secondary | ICD-10-CM | POA: Diagnosis not present

## 2017-03-16 DIAGNOSIS — H6123 Impacted cerumen, bilateral: Secondary | ICD-10-CM | POA: Diagnosis not present

## 2017-03-17 DIAGNOSIS — K648 Other hemorrhoids: Secondary | ICD-10-CM | POA: Diagnosis not present

## 2017-04-12 DIAGNOSIS — M8589 Other specified disorders of bone density and structure, multiple sites: Secondary | ICD-10-CM | POA: Diagnosis not present

## 2017-04-12 DIAGNOSIS — Z1231 Encounter for screening mammogram for malignant neoplasm of breast: Secondary | ICD-10-CM | POA: Diagnosis not present

## 2017-04-27 DIAGNOSIS — E559 Vitamin D deficiency, unspecified: Secondary | ICD-10-CM | POA: Diagnosis not present

## 2017-04-27 DIAGNOSIS — D510 Vitamin B12 deficiency anemia due to intrinsic factor deficiency: Secondary | ICD-10-CM | POA: Diagnosis not present

## 2017-04-27 DIAGNOSIS — E782 Mixed hyperlipidemia: Secondary | ICD-10-CM | POA: Diagnosis not present

## 2017-04-27 DIAGNOSIS — Z79899 Other long term (current) drug therapy: Secondary | ICD-10-CM | POA: Diagnosis not present

## 2017-05-04 ENCOUNTER — Other Ambulatory Visit: Payer: Self-pay | Admitting: Family Medicine

## 2017-05-04 DIAGNOSIS — F411 Generalized anxiety disorder: Secondary | ICD-10-CM | POA: Diagnosis not present

## 2017-05-04 DIAGNOSIS — R748 Abnormal levels of other serum enzymes: Secondary | ICD-10-CM | POA: Diagnosis not present

## 2017-05-04 DIAGNOSIS — Z6826 Body mass index (BMI) 26.0-26.9, adult: Secondary | ICD-10-CM | POA: Diagnosis not present

## 2017-05-04 DIAGNOSIS — R8290 Unspecified abnormal findings in urine: Secondary | ICD-10-CM | POA: Diagnosis not present

## 2017-05-04 DIAGNOSIS — E782 Mixed hyperlipidemia: Secondary | ICD-10-CM | POA: Diagnosis not present

## 2017-05-04 DIAGNOSIS — R945 Abnormal results of liver function studies: Principal | ICD-10-CM

## 2017-05-04 DIAGNOSIS — R7989 Other specified abnormal findings of blood chemistry: Secondary | ICD-10-CM

## 2017-05-04 DIAGNOSIS — D510 Vitamin B12 deficiency anemia due to intrinsic factor deficiency: Secondary | ICD-10-CM | POA: Diagnosis not present

## 2017-05-04 DIAGNOSIS — F331 Major depressive disorder, recurrent, moderate: Secondary | ICD-10-CM | POA: Diagnosis not present

## 2017-05-09 ENCOUNTER — Other Ambulatory Visit: Payer: Self-pay

## 2017-05-09 ENCOUNTER — Emergency Department (HOSPITAL_BASED_OUTPATIENT_CLINIC_OR_DEPARTMENT_OTHER): Payer: Medicare Other

## 2017-05-09 ENCOUNTER — Encounter (HOSPITAL_BASED_OUTPATIENT_CLINIC_OR_DEPARTMENT_OTHER): Payer: Self-pay | Admitting: Emergency Medicine

## 2017-05-09 ENCOUNTER — Emergency Department (HOSPITAL_BASED_OUTPATIENT_CLINIC_OR_DEPARTMENT_OTHER)
Admission: EM | Admit: 2017-05-09 | Discharge: 2017-05-09 | Disposition: A | Discharge status: Home / Self Care | Payer: Medicare Other | Attending: Emergency Medicine | Admitting: Emergency Medicine

## 2017-05-09 DIAGNOSIS — Z79899 Other long term (current) drug therapy: Secondary | ICD-10-CM | POA: Insufficient documentation

## 2017-05-09 DIAGNOSIS — W010XXA Fall on same level from slipping, tripping and stumbling without subsequent striking against object, initial encounter: Secondary | ICD-10-CM | POA: Diagnosis not present

## 2017-05-09 DIAGNOSIS — Y92002 Bathroom of unspecified non-institutional (private) residence single-family (private) house as the place of occurrence of the external cause: Secondary | ICD-10-CM | POA: Insufficient documentation

## 2017-05-09 DIAGNOSIS — H538 Other visual disturbances: Secondary | ICD-10-CM | POA: Diagnosis not present

## 2017-05-09 DIAGNOSIS — S199XXA Unspecified injury of neck, initial encounter: Secondary | ICD-10-CM | POA: Diagnosis not present

## 2017-05-09 DIAGNOSIS — S0003XA Contusion of scalp, initial encounter: Secondary | ICD-10-CM | POA: Insufficient documentation

## 2017-05-09 DIAGNOSIS — Y9389 Activity, other specified: Secondary | ICD-10-CM | POA: Diagnosis not present

## 2017-05-09 DIAGNOSIS — R51 Headache: Secondary | ICD-10-CM | POA: Diagnosis not present

## 2017-05-09 DIAGNOSIS — F0781 Postconcussional syndrome: Secondary | ICD-10-CM | POA: Insufficient documentation

## 2017-05-09 DIAGNOSIS — Y999 Unspecified external cause status: Secondary | ICD-10-CM | POA: Diagnosis not present

## 2017-05-09 DIAGNOSIS — S0990XA Unspecified injury of head, initial encounter: Secondary | ICD-10-CM | POA: Diagnosis not present

## 2017-05-09 NOTE — ED Notes (Signed)
ED Provider at bedside. Law, [place]

## 2017-05-09 NOTE — ED Notes (Signed)
Patient ambulated to CT with steady gait.

## 2017-05-09 NOTE — ED Provider Notes (Signed)
[facility] EMERGENCY DEPARTMENT Provider Note   CSN: [id number] Arrival date & time: 05/09/17  1342     History   Chief Complaint Chief Complaint  Patient presents with  . Head Injury    HPI Jade Williams is a 67 y.o. female with history of hyperlipidemia, pernicious anemia who presents following fall that occurred about 24 hours ago.  Patient reports she tripped on her bathroom rug which sent her flying about 3 feet where she hit her head on the wall and contorted her neck.  She did not lose consciousness.  She did have a bump on her left forehead which is improved now.  She has had tension in her neck and back since and has noticed some intermittent blurred vision when she was watching television.  She states she does not normally wear glasses, but felt like she needed to.  She denies any headache, lightheadedness, dizziness, nausea, vomiting, chest pain, shortness of breath.  She did not take any medications prior to arrival.  She is not on anticoagulants.  HPI  Past Medical History:  Diagnosis Date  . Hyperlipidemia   . Pernicious anemia     There are no active problems to display for this patient.   Past Surgical History:  Procedure Laterality Date  . APPENDECTOMY    . TONSILLECTOMY       OB History   None      Home Medications    Prior to Admission medications   Medication Sig Start Date End Date Taking? Authorizing Provider  atorvastatin (LIPITOR) 10 MG tablet Take 10 mg by mouth daily.   Yes [provider]  calcium carbonate (CALCIUM 500) 1250 MG tablet Take 2 tablets by mouth daily.   Yes [provider]  cholecalciferol (VITAMIN D) 1000 UNITS tablet Take 2,000 Units by mouth daily.   Yes [provider]  Coenzyme Q10 (CO Q 10 PO) Take by mouth.   Yes [provider]  COLLAGEN PO Take by mouth.   Yes [provider]  collagenase (SANTYL) ointment Apply 1 application topically daily.   Yes  [provider]  FLUoxetine (PROZAC) 10 MG tablet Take 10 mg by mouth daily.   Yes [provider]  multivitamin-lutein (OCUVITE-LUTEIN) CAPS capsule Take 1 capsule by mouth daily.   Yes [provider]  OVER THE COUNTER MEDICATION Take by mouth daily. tumeric   Yes [provider]  OVER THE COUNTER MEDICATION Take by mouth daily. Emercency=1 packet per day   Yes [provider]  vitamin E 400 UNIT capsule Take 400 Units by mouth daily.   Yes [provider]  B Complex-C (B-COMPLEX WITH VITAMIN C) tablet Take 1 tablet by mouth daily.    [provider]  OVER THE COUNTER MEDICATION Take 1 tablet by mouth daily. Plant based to lower chlolesterol    [provider]  OVER THE COUNTER MEDICATION daily. Cartiledge=for joint pain    [provider]  simvastatin (ZOCOR) 10 MG tablet Take 10 mg by mouth at bedtime.    [provider]    Family History No family history on file.  Social History Social History   Tobacco Use  . Smoking status: Never Smoker  . Smokeless tobacco: Never Used  Substance Use Topics  . Alcohol use: Yes    Comment: ocassional wine  . Drug use: No     Allergies   Patient has no known allergies.   Review of Systems Review of Systems  Constitutional: Negative for chills and fever.  HENT: Negative for facial swelling and sore throat.   Eyes: Positive for visual disturbance.  Respiratory: Negative for shortness of breath.   Cardiovascular: Negative for chest pain.  Gastrointestinal: Negative for abdominal pain, nausea and vomiting.  Genitourinary: Negative for dysuria.  Musculoskeletal: Positive for back pain ("tightness") and neck pain ("tightness").  Skin: Negative for rash and wound.  Neurological: Negative for dizziness, syncope, speech difficulty, light-headedness and headaches.  Psychiatric/Behavioral: The patient is not nervous/anxious.      Physical Exam Updated  Vital Signs BP (!) 180/83 (BP Location: Right Arm)   Pulse 66   Temp 98.6 F (37 C) (Oral)   Resp 18   Ht 5\' 3"  (1.6 m)   Wt 72.6 kg (160 lb)   SpO2 98%   BMI 28.34 kg/m   Physical Exam  Constitutional: She appears well-developed and well-nourished. No distress.  HENT:  Head: Normocephalic and atraumatic.  Mouth/Throat: Oropharynx is clear and moist. No oropharyngeal exudate.  Very subtle, <1cm hematoma to the L frontal scalp  Eyes: Pupils are equal, round, and reactive to light. Conjunctivae and EOM are normal. Right eye exhibits no discharge. Left eye exhibits no discharge. No scleral icterus.  Patient reported a little difficulty with EOMs in the upper left, however, no noticeable difficulty  Neck: Normal range of motion. Neck supple. No thyromegaly present.  Cardiovascular: Normal rate, regular rhythm, normal heart sounds and intact distal pulses. Exam reveals no gallop and no friction rub.  No murmur heard. Pulmonary/Chest: Effort normal and breath sounds normal. No stridor. No respiratory distress. She has no wheezes. She has no rales.  Abdominal: Soft. Bowel sounds are normal. She exhibits no distension. There is no tenderness. There is no rebound and no guarding.  Musculoskeletal: She exhibits no edema.  No midline cervical, thoracic, or lumbar tenderness Some tenderness to the bilateral upper trapezius musculature, patient states this is typical for her  Lymphadenopathy:    She has no cervical adenopathy.  Neurological: She is alert. Coordination normal.  CN 3-12 intact; normal sensation throughout; 5/5 strength in all 4 extremities; equal bilateral grip strength; no ataxia on finger to nose  Skin: Skin is warm and dry. No rash noted. She is not diaphoretic. No pallor.  Psychiatric: She has a normal mood and affect.  Nursing note and vitals reviewed.    ED Treatments / Results  Labs (all labs ordered are listed, but only abnormal results are displayed) Labs Reviewed  - No data to display  EKG None  Radiology Ct Head Wo Contrast  Addendum Date: 05/09/2017   ADDENDUM REPORT: 05/09/2017 15:38 ADDENDUM: Voice recognition error: The second sentence of the impressions section should read "No acute trauma to the head. " Electronically Signed   By: San Morelle M.D.   On: 05/09/2017 15:38   Result Date: 05/09/2017 CLINICAL DATA:  Tripped and fell yesterday. Hit head on wall. Head pain and blurred vision to the right eye. Headache. Not feeling normal. Head trauma, minor, GCS greater than 13. EXAM: CT HEAD WITHOUT CONTRAST CT CERVICAL SPINE WITHOUT CONTRAST TECHNIQUE: Multidetector CT imaging of the head and cervical spine was performed following the standard protocol without intravenous contrast. Multiplanar CT image reconstructions of the cervical spine were also generated. COMPARISON:  None. FINDINGS: CT HEAD FINDINGS Brain: No acute infarct, hemorrhage, or mass lesion is present. The ventricles are of normal size. No significant extraaxial fluid collection is present. No significant white matter disease is present. Brainstem  and cerebellum are normal. Vascular: Mild atherosclerotic calcifications are present in the cavernous internal carotid arteries bilaterally and at the left vertebral artery dural margin. Skull: Calvarium is intact. No focal lytic or blastic lesions are present. No significant extracranial soft tissue injury is present. There is no he acute or healing fracture. Sinuses/Orbits: The paranasal sinuses and mastoid air cells are clear. Globes and orbits are within normal limits. CT CERVICAL SPINE FINDINGS Alignment: Slight degenerative retrolisthesis is present at C5-6. Slight degenerative anterolisthesis is present at C3-4 and C4-5. Skull base and vertebrae: The craniocervical junction is within normal limits. Vertebral body heights are maintained. No acute or healing fractures are present. Soft tissues and spinal canal: No prevertebral fluid or  swelling. No visible canal hematoma. Disc levels: Uncovertebral and facet hypertrophy is present bilaterally at C5-6. Facet hypertrophy contributes to mild foraminal narrowing at C3-4, left greater than right. Mild foraminal narrowing is present bilaterally at C4-5 due to facet spurring. Upper chest: The lung apices are clear. The thoracic inlet is within normal limits. IMPRESSION: 1. Normal CT appearance of the brain. 2. Acute trauma to the head. 3. Mild degenerative changes of the upper cervical spine from C3-4 through C5-6 as described. 4. No acute fracture or traumatic subluxation in the cervical spine. Electronically Signed: By: San Morelle M.D. On: 05/09/2017 15:26   Ct Cervical Spine Wo Contrast  Addendum Date: 05/09/2017   ADDENDUM REPORT: 05/09/2017 15:38 ADDENDUM: Voice recognition error: The second sentence of the impressions section should read "No acute trauma to the head. " Electronically Signed   By: San Morelle M.D.   On: 05/09/2017 15:38   Result Date: 05/09/2017 CLINICAL DATA:  Tripped and fell yesterday. Hit head on wall. Head pain and blurred vision to the right eye. Headache. Not feeling normal. Head trauma, minor, GCS greater than 13. EXAM: CT HEAD WITHOUT CONTRAST CT CERVICAL SPINE WITHOUT CONTRAST TECHNIQUE: Multidetector CT imaging of the head and cervical spine was performed following the standard protocol without intravenous contrast. Multiplanar CT image reconstructions of the cervical spine were also generated. COMPARISON:  None. FINDINGS: CT HEAD FINDINGS Brain: No acute infarct, hemorrhage, or mass lesion is present. The ventricles are of normal size. No significant extraaxial fluid collection is present. No significant white matter disease is present. Brainstem and cerebellum are normal. Vascular: Mild atherosclerotic calcifications are present in the cavernous internal carotid arteries bilaterally and at the left vertebral artery dural margin. Skull: Calvarium  is intact. No focal lytic or blastic lesions are present. No significant extracranial soft tissue injury is present. There is no he acute or healing fracture. Sinuses/Orbits: The paranasal sinuses and mastoid air cells are clear. Globes and orbits are within normal limits. CT CERVICAL SPINE FINDINGS Alignment: Slight degenerative retrolisthesis is present at C5-6. Slight degenerative anterolisthesis is present at C3-4 and C4-5. Skull base and vertebrae: The craniocervical junction is within normal limits. Vertebral body heights are maintained. No acute or healing fractures are present. Soft tissues and spinal canal: No prevertebral fluid or swelling. No visible canal hematoma. Disc levels: Uncovertebral and facet hypertrophy is present bilaterally at C5-6. Facet hypertrophy contributes to mild foraminal narrowing at C3-4, left greater than right. Mild foraminal narrowing is present bilaterally at C4-5 due to facet spurring. Upper chest: The lung apices are clear. The thoracic inlet is within normal limits. IMPRESSION: 1. Normal CT appearance of the brain. 2. Acute trauma to the head. 3. Mild degenerative changes of the upper cervical spine from C3-4 through C5-6  as described. 4. No acute fracture or traumatic subluxation in the cervical spine. Electronically Signed: By: San Morelle M.D. On: 05/09/2017 15:26    Procedures Procedures (including critical care time)  Medications Ordered in ED Medications - No data to display   Initial Impression / Assessment and Plan / ED Course  I have reviewed the triage vital signs and the nursing notes.  Pertinent labs & imaging results that were available during my care of the patient were reviewed by me and considered in my medical decision making (see chart for details).     Patient with probably post concussion symptoms. Ct head and C spine are negative for acute findings. Normal neuro exam without focal deficits. Patient advised to follow up with PCP  for recheck in 3-4 days. Supportive treatment discussed including avoidance of screens, reading. Return precautions discussed. Patient understands and agrees with plan. Patient discharged in satisfactory condition. Patient also evaluated by Dr. Ashok Cordia who guided the patient's management and agrees with plan.  Final Clinical Impressions(s) / ED Diagnoses   Final diagnoses:  Blurred vision  Contusion of scalp, initial encounter  Post concussion syndrome    ED Discharge Orders    None       Caryl Ada 05/09/17 2006    Lajean Saver, MD 05/18/17 [phone]

## 2017-05-09 NOTE — ED Triage Notes (Addendum)
States she tripped and fell yesterday, hitting her head on the wall. Denies LOC, does not take blood thinners. C/o head pain and blurred vision to R eye.

## 2017-05-09 NOTE — Discharge Instructions (Signed)
You can take Tylenol as needed for headache.  Try to avoid reading or staring screens for too long.  Make sure to drink plenty of fluids and get plenty of rest.  Please follow-up with your doctor this coming week for recheck.  Please return to the emergency department if you develop any new or worsening symptoms.

## 2017-05-13 DIAGNOSIS — Z6827 Body mass index (BMI) 27.0-27.9, adult: Secondary | ICD-10-CM | POA: Diagnosis not present

## 2017-05-13 DIAGNOSIS — F0781 Postconcussional syndrome: Secondary | ICD-10-CM | POA: Diagnosis not present

## 2017-05-13 DIAGNOSIS — S161XXD Strain of muscle, fascia and tendon at neck level, subsequent encounter: Secondary | ICD-10-CM | POA: Diagnosis not present

## 2017-05-13 DIAGNOSIS — S0093XD Contusion of unspecified part of head, subsequent encounter: Secondary | ICD-10-CM | POA: Diagnosis not present

## 2017-05-14 ENCOUNTER — Other Ambulatory Visit: Payer: Medicare Other

## 2017-05-18 ENCOUNTER — Other Ambulatory Visit: Payer: Medicare Other

## 2017-05-27 ENCOUNTER — Ambulatory Visit
Admission: RE | Admit: 2017-05-27 | Discharge: 2017-05-27 | Disposition: A | Discharge status: Home / Self Care | Payer: Medicare Other | Source: Ambulatory Visit | Attending: Family Medicine | Admitting: Family Medicine

## 2017-05-27 DIAGNOSIS — R7989 Other specified abnormal findings of blood chemistry: Secondary | ICD-10-CM | POA: Diagnosis not present

## 2017-05-27 DIAGNOSIS — R945 Abnormal results of liver function studies: Principal | ICD-10-CM

## 2017-07-21 DIAGNOSIS — K648 Other hemorrhoids: Secondary | ICD-10-CM | POA: Diagnosis not present

## 2017-10-28 DIAGNOSIS — E785 Hyperlipidemia, unspecified: Secondary | ICD-10-CM | POA: Diagnosis not present

## 2017-10-28 DIAGNOSIS — Z79899 Other long term (current) drug therapy: Secondary | ICD-10-CM | POA: Diagnosis not present

## 2017-10-28 DIAGNOSIS — R945 Abnormal results of liver function studies: Secondary | ICD-10-CM | POA: Diagnosis not present

## 2017-10-28 DIAGNOSIS — D510 Vitamin B12 deficiency anemia due to intrinsic factor deficiency: Secondary | ICD-10-CM | POA: Diagnosis not present

## 2017-10-28 DIAGNOSIS — E559 Vitamin D deficiency, unspecified: Secondary | ICD-10-CM | POA: Diagnosis not present

## 2017-10-28 DIAGNOSIS — R5383 Other fatigue: Secondary | ICD-10-CM | POA: Diagnosis not present

## 2017-11-04 DIAGNOSIS — E782 Mixed hyperlipidemia: Secondary | ICD-10-CM | POA: Diagnosis not present

## 2017-11-04 DIAGNOSIS — Z6827 Body mass index (BMI) 27.0-27.9, adult: Secondary | ICD-10-CM | POA: Diagnosis not present

## 2017-11-04 DIAGNOSIS — E559 Vitamin D deficiency, unspecified: Secondary | ICD-10-CM | POA: Diagnosis not present

## 2017-11-04 DIAGNOSIS — R749 Abnormal serum enzyme level, unspecified: Secondary | ICD-10-CM | POA: Diagnosis not present

## 2017-11-04 DIAGNOSIS — J309 Allergic rhinitis, unspecified: Secondary | ICD-10-CM | POA: Diagnosis not present

## 2017-11-17 DIAGNOSIS — Z6826 Body mass index (BMI) 26.0-26.9, adult: Secondary | ICD-10-CM | POA: Diagnosis not present

## 2017-11-17 DIAGNOSIS — J309 Allergic rhinitis, unspecified: Secondary | ICD-10-CM | POA: Diagnosis not present

## 2017-11-17 DIAGNOSIS — E782 Mixed hyperlipidemia: Secondary | ICD-10-CM | POA: Diagnosis not present

## 2017-11-17 DIAGNOSIS — Z0000 Encounter for general adult medical examination without abnormal findings: Secondary | ICD-10-CM | POA: Diagnosis not present

## 2017-11-17 DIAGNOSIS — Z0110 Encounter for examination of ears and hearing without abnormal findings: Secondary | ICD-10-CM | POA: Diagnosis not present

## 2017-11-17 DIAGNOSIS — K219 Gastro-esophageal reflux disease without esophagitis: Secondary | ICD-10-CM | POA: Diagnosis not present

## 2017-11-17 DIAGNOSIS — H9313 Tinnitus, bilateral: Secondary | ICD-10-CM | POA: Diagnosis not present

## 2017-11-17 DIAGNOSIS — Z23 Encounter for immunization: Secondary | ICD-10-CM | POA: Diagnosis not present

## 2017-12-06 DIAGNOSIS — D2262 Melanocytic nevi of left upper limb, including shoulder: Secondary | ICD-10-CM | POA: Diagnosis not present

## 2017-12-06 DIAGNOSIS — D2271 Melanocytic nevi of right lower limb, including hip: Secondary | ICD-10-CM | POA: Diagnosis not present

## 2017-12-06 DIAGNOSIS — D485 Neoplasm of uncertain behavior of skin: Secondary | ICD-10-CM | POA: Diagnosis not present

## 2017-12-06 DIAGNOSIS — D2261 Melanocytic nevi of right upper limb, including shoulder: Secondary | ICD-10-CM | POA: Diagnosis not present

## 2017-12-06 DIAGNOSIS — L821 Other seborrheic keratosis: Secondary | ICD-10-CM | POA: Diagnosis not present

## 2017-12-06 DIAGNOSIS — D225 Melanocytic nevi of trunk: Secondary | ICD-10-CM | POA: Diagnosis not present

## 2017-12-06 DIAGNOSIS — L814 Other melanin hyperpigmentation: Secondary | ICD-10-CM | POA: Diagnosis not present

## 2017-12-06 DIAGNOSIS — D220 Melanocytic nevi of lip: Secondary | ICD-10-CM | POA: Diagnosis not present

## 2017-12-06 DIAGNOSIS — D2272 Melanocytic nevi of left lower limb, including hip: Secondary | ICD-10-CM | POA: Diagnosis not present

## 2018-01-28 DIAGNOSIS — R945 Abnormal results of liver function studies: Secondary | ICD-10-CM | POA: Diagnosis not present

## 2018-01-28 DIAGNOSIS — Z79899 Other long term (current) drug therapy: Secondary | ICD-10-CM | POA: Diagnosis not present

## 2018-01-28 DIAGNOSIS — E785 Hyperlipidemia, unspecified: Secondary | ICD-10-CM | POA: Diagnosis not present

## 2018-02-02 DIAGNOSIS — J309 Allergic rhinitis, unspecified: Secondary | ICD-10-CM | POA: Diagnosis not present

## 2018-02-02 DIAGNOSIS — F331 Major depressive disorder, recurrent, moderate: Secondary | ICD-10-CM | POA: Diagnosis not present

## 2018-02-02 DIAGNOSIS — R749 Abnormal serum enzyme level, unspecified: Secondary | ICD-10-CM | POA: Diagnosis not present

## 2018-02-02 DIAGNOSIS — E782 Mixed hyperlipidemia: Secondary | ICD-10-CM | POA: Diagnosis not present

## 2018-02-02 DIAGNOSIS — F419 Anxiety disorder, unspecified: Secondary | ICD-10-CM | POA: Diagnosis not present

## 2018-02-11 ENCOUNTER — Encounter: Payer: Self-pay | Admitting: Interventional Cardiology

## 2018-03-13 NOTE — Progress Notes (Signed)
Cardiology Office Note:    Date:  03/14/2018   ID:  Marigny Borre, DOB 09/10/1950, MRN [id number]  PCP:  Fanny Bien, MD  Cardiologist:  No primary care provider on file.   Referring MD: Fanny Bien, MD   Chief Complaint  Patient presents with  . Shortness of Breath  . Jaw Pain  . Hyperlipidemia    History of Present Illness:    Jade Williams is a 68 y.o. female with a hx of hyperlipidemia referred by Dr. Rachell Cipro for consultation regarding jaw and back pain.  4-year history of intermittent episodes of right subscapular discomfort associated with bilateral mandibular pain.  Episodes last up to 30 minutes.  Brought on by activity but can also occur at rest.  No associated nausea, diaphoresis, palpitations, or dyspnea.  Progressive exertional dyspnea noted over the past year.  She is uncertain if it is related to conditioning and aging or some other problem.  Risk factors for coronary disease include longstanding hyperlipidemia.  She does not have hypertension, hyperlipidemia, or significant family history of premature atherosclerosis.  She was never a smoker.  Past Medical History:  Diagnosis Date  . Hyperlipidemia   . Pernicious anemia     Past Surgical History:  Procedure Laterality Date  . APPENDECTOMY    . TONSILLECTOMY      Current Medications: Current Meds  Medication Sig  . atorvastatin (LIPITOR) 10 MG tablet Take 10 mg by mouth daily.  . calcium carbonate (CALCIUM 500) 1250 MG tablet Take 2 tablets by mouth daily.  . cholecalciferol (VITAMIN D) 1000 UNITS tablet Take 5,000 Units by mouth daily.   . Coenzyme Q10 (CO Q 10 PO) Take by mouth.  . COLLAGEN PO Take by mouth.  . cyanocobalamin (,VITAMIN B-12,) 1000 MCG/ML injection INJECT 1 ML INTRAMUSCULARLY EVERY 30 DAYS  . FLUoxetine (PROZAC) 20 MG tablet Take 20 mg by mouth daily.   . multivitamin-lutein (OCUVITE-LUTEIN) CAPS capsule Take 1 capsule by mouth daily.  Marland Kitchen OVER THE COUNTER  MEDICATION Take by mouth daily. tumeric  . OVER THE COUNTER MEDICATION Take by mouth daily. Emercency=1 packet per day  . vitamin E 400 UNIT capsule Take 400 Units by mouth daily.     Allergies:   Patient has no known allergies.   Social History   Socioeconomic History  . Marital status: Single    Spouse name: Not on file  . Number of children: Not on file  . Years of education: Not on file  . Highest education level: Not on file  Occupational History  . Not on file  Social Needs  . Financial resource strain: Not on file  . Food insecurity:    Worry: Not on file    Inability: Not on file  . Transportation needs:    Medical: Not on file    Non-medical: Not on file  Tobacco Use  . Smoking status: Never Smoker  . Smokeless tobacco: Never Used  Substance and Sexual Activity  . Alcohol use: Yes    Comment: ocassional wine  . Drug use: No  . Sexual activity: Not on file  Lifestyle  . Physical activity:    Days per week: Not on file    Minutes per session: Not on file  . Stress: Not on file  Relationships  . Social connections:    Talks on phone: Not on file    Gets together: Not on file    Attends religious service: Not on file    Active  member of club or organization: Not on file    Attends meetings of clubs or organizations: Not on file    Relationship status: Not on file  Other Topics Concern  . Not on file  Social History Narrative  . Not on file     Family History: The patient's family history is not on file.  ROS:   Please see the history of present illness.    Back pain is only other complaint.  All other systems reviewed and are negative.  EKGs/Labs/Other Studies Reviewed:    The following studies were reviewed today: No cardiac functional or imaging data is available  EKG:  EKG performed March 14, 2018 reveals normal sinus rhythm with normal overall appearance.  Recent Labs: No results found for requested labs within last 8760 hours.  Recent  Lipid Panel No results found for: CHOL, TRIG, HDL, CHOLHDL, VLDL, LDLCALC, LDLDIRECT  Physical Exam:    VS:  BP 140/78   Pulse 74   Ht 5\' 3"  (1.6 m)   Wt 155 lb 9.6 oz (70.6 kg)   SpO2 94%   BMI 27.56 kg/m     Wt Readings from Last 3 Encounters:  03/14/18 155 lb 9.6 oz (70.6 kg)  05/09/17 160 lb (72.6 kg)  01/31/13 155 lb (70.3 kg)     GEN: Mildly obese. No acute distress HEENT: Normal NECK: No JVD. LYMPHATICS: No lymphadenopathy CARDIAC: RRR.  No murmur, no gallop, no edema VASCULAR: 2+ bilateral radial and posterior tibial pulses, no carotid bruits RESPIRATORY:  Clear to auscultation without rales, wheezing or rhonchi  ABDOMEN: Soft, non-tender, non-distended, No pulsatile mass, MUSCULOSKELETAL: No deformity  SKIN: Warm and dry NEUROLOGIC:  Alert and oriented x 3 PSYCHIATRIC:  Normal affect   ASSESSMENT:    1. SOB (shortness of breath)   2. Jaw pain   3. Hyperlipidemia LDL goal <70    PLAN:    In order of problems listed above:  1. Exertional dyspnea, worsening over the past year in association with hyperlipidemia, interscapular discomfort with bilateral jaw discomfort raise a question of myocardial ischemia underlying.  I have recommended a stress myocardial perfusion study to exclude CAD. 2. No other explanation for jaw discomfort in absence of dental.  Stress test will help exclude ischemia. 3. Under the circumstances, her statin target should be 70 or less.  We will need to get the lipid values from Dr. Ernie Hew.  Plan stress myocardial perfusion imaging to exclude atypical presentation for coronary related ischemia.   Medication Adjustments/Labs and Tests Ordered: Current medicines are reviewed at length with the patient today.  Concerns regarding medicines are outlined above.  Orders Placed This Encounter  Procedures  . MYOCARDIAL PERFUSION IMAGING  . EKG 12-Lead   No orders of the defined types were placed in this encounter.   Patient Instructions    Medication Instructions:  Your physician recommends that you continue on your current medications as directed. Please refer to the Current Medication list given to you today.  If you need a refill on your cardiac medications before your next appointment, please call your pharmacy.   Lab work: None ordered If you have labs (blood work) drawn today and your tests are completely normal, you will receive your results only by: Marland Kitchen MyChart Message (if you have MyChart) OR . A paper copy in the mail If you have any lab test that is abnormal or we need to change your treatment, we will call you to review the results.  Testing/Procedures:  Your physician has requested that you have en exercise stress myoview. For further information please visit [email or web address]. Please follow instruction sheet, as given.  Follow-Up: . AS NEEDED   Any Other Special Instructions Will Be Listed Below (If Applicable).     Signed, Sinclair Grooms, MD  03/14/2018 4:17 PM    [facility]

## 2018-03-14 ENCOUNTER — Encounter: Payer: Self-pay | Admitting: Interventional Cardiology

## 2018-03-14 ENCOUNTER — Ambulatory Visit (INDEPENDENT_AMBULATORY_CARE_PROVIDER_SITE_OTHER): Payer: Medicare Other | Admitting: Interventional Cardiology

## 2018-03-14 ENCOUNTER — Encounter

## 2018-03-14 VITALS — BP 140/78 | HR 74 | Ht 63.0 in | Wt 155.6 lb

## 2018-03-14 DIAGNOSIS — R6884 Jaw pain: Secondary | ICD-10-CM | POA: Diagnosis not present

## 2018-03-14 DIAGNOSIS — E785 Hyperlipidemia, unspecified: Secondary | ICD-10-CM | POA: Diagnosis not present

## 2018-03-14 DIAGNOSIS — R0602 Shortness of breath: Secondary | ICD-10-CM | POA: Diagnosis not present

## 2018-03-14 NOTE — Patient Instructions (Signed)
Medication Instructions:  Your physician recommends that you continue on your current medications as directed. Please refer to the Current Medication list given to you today.  If you need a refill on your cardiac medications before your next appointment, please call your pharmacy.   Lab work: None ordered If you have labs (blood work) drawn today and your tests are completely normal, you will receive your results only by: Marland Kitchen MyChart Message (if you have MyChart) OR . A paper copy in the mail If you have any lab test that is abnormal or we need to change your treatment, we will call you to review the results.  Testing/Procedures: Your physician has requested that you have en exercise stress myoview. For further information please visit [email or web address]. Please follow instruction sheet, as given.  Follow-Up: . AS NEEDED   Any Other Special Instructions Will Be Listed Below (If Applicable).

## 2018-03-16 DIAGNOSIS — H5213 Myopia, bilateral: Secondary | ICD-10-CM | POA: Diagnosis not present

## 2018-03-16 DIAGNOSIS — H524 Presbyopia: Secondary | ICD-10-CM | POA: Diagnosis not present

## 2018-03-16 DIAGNOSIS — H52223 Regular astigmatism, bilateral: Secondary | ICD-10-CM | POA: Diagnosis not present

## 2018-03-16 DIAGNOSIS — H2513 Age-related nuclear cataract, bilateral: Secondary | ICD-10-CM | POA: Diagnosis not present

## 2018-03-23 ENCOUNTER — Telehealth (HOSPITAL_COMMUNITY): Payer: Self-pay

## 2018-03-23 NOTE — Telephone Encounter (Signed)
Contacted the patient to give her instructions. She stated that she's sick and wanted to reschedule. She's now set up for 04-04-2018 at 10:45. S.Sheva Mcdougle EMTP

## 2018-03-24 ENCOUNTER — Encounter (HOSPITAL_COMMUNITY): Payer: Medicare Other

## 2018-03-31 DIAGNOSIS — E785 Hyperlipidemia, unspecified: Secondary | ICD-10-CM | POA: Diagnosis not present

## 2018-03-31 DIAGNOSIS — R945 Abnormal results of liver function studies: Secondary | ICD-10-CM | POA: Diagnosis not present

## 2018-04-04 ENCOUNTER — Ambulatory Visit (HOSPITAL_COMMUNITY): Payer: Medicare Other | Attending: Interventional Cardiology

## 2018-04-04 ENCOUNTER — Encounter: Payer: Medicare Other | Admitting: *Deleted

## 2018-04-04 DIAGNOSIS — Z006 Encounter for examination for normal comparison and control in clinical research program: Secondary | ICD-10-CM

## 2018-04-04 DIAGNOSIS — R6884 Jaw pain: Secondary | ICD-10-CM | POA: Insufficient documentation

## 2018-04-04 DIAGNOSIS — E785 Hyperlipidemia, unspecified: Secondary | ICD-10-CM | POA: Diagnosis not present

## 2018-04-04 DIAGNOSIS — R0602 Shortness of breath: Secondary | ICD-10-CM | POA: Insufficient documentation

## 2018-04-04 NOTE — Research (Signed)
CADFEM Informed Consent                  Subject Name:   Jade Williams   Subject met inclusion and exclusion criteria.  The informed consent form, study requirements and expectations were reviewed with the subject and questions and concerns were addressed prior to the signing of the consent form.  The subject verbalized understanding of the trial requirements.  The subject agreed to participate in the CADFEM trial and signed the informed consent.  The informed consent was obtained prior to performance of any protocol-specific procedures for the subject.  A copy of the signed informed consent was given to the subject and a copy was placed in the subject's medical record. This patient was consented by Berneda Rose on 04-04-18 at 10:00   [place] Sachiko Methot, Research Assistant 04/04/2018 10:00 a.m.

## 2018-04-05 DIAGNOSIS — E782 Mixed hyperlipidemia: Secondary | ICD-10-CM | POA: Diagnosis not present

## 2018-04-05 DIAGNOSIS — R749 Abnormal serum enzyme level, unspecified: Secondary | ICD-10-CM | POA: Diagnosis not present

## 2018-04-05 DIAGNOSIS — R748 Abnormal levels of other serum enzymes: Secondary | ICD-10-CM | POA: Diagnosis not present

## 2018-04-05 DIAGNOSIS — Z6825 Body mass index (BMI) 25.0-25.9, adult: Secondary | ICD-10-CM | POA: Diagnosis not present

## 2018-04-05 DIAGNOSIS — R5383 Other fatigue: Secondary | ICD-10-CM | POA: Diagnosis not present

## 2018-04-08 ENCOUNTER — Ambulatory Visit (HOSPITAL_COMMUNITY): Payer: Medicare Other

## 2018-04-12 ENCOUNTER — Ambulatory Visit (HOSPITAL_COMMUNITY): Payer: Medicare Other | Attending: Cardiology

## 2018-04-12 LAB — MYOCARDIAL PERFUSION IMAGING
CHL CUP NUCLEAR SRS: 4
CSEPHR: 101 %
Estimated workload: 7 METS
Exercise duration (min): 6 min
Exercise duration (sec): 0 s
LVDIAVOL: 59 mL (ref 46–106)
LVSYSVOL: 18 mL
MPHR: 151 {beats}/min
NUC STRESS TID: 0.79
Peak HR: 153 {beats}/min
Rest HR: 67 {beats}/min
SDS: 1
SSS: 4

## 2018-04-12 MED ORDER — TECHNETIUM TC 99M TETROFOSMIN IV KIT
31.2000 | PACK | Freq: Once | INTRAVENOUS | Status: AC | PRN
  Administered 2018-04-12: 31.2 via INTRAVENOUS
  Filled 2018-04-12: qty 32

## 2018-04-13 ENCOUNTER — Telehealth: Payer: Self-pay

## 2018-04-13 NOTE — Telephone Encounter (Signed)
LMTCB

## 2018-04-13 NOTE — Telephone Encounter (Signed)
-----   Message from Belva Crome, MD sent at 04/12/2018  8:16 PM EST ----- Let the patient know there is no evidence of ischemia/blockage. A copy will be sent to Fanny Bien, MD

## 2018-05-19 DIAGNOSIS — Z719 Counseling, unspecified: Secondary | ICD-10-CM | POA: Diagnosis not present

## 2018-05-19 DIAGNOSIS — F411 Generalized anxiety disorder: Secondary | ICD-10-CM | POA: Diagnosis not present

## 2018-05-19 DIAGNOSIS — F331 Major depressive disorder, recurrent, moderate: Secondary | ICD-10-CM | POA: Diagnosis not present

## 2018-05-19 DIAGNOSIS — D510 Vitamin B12 deficiency anemia due to intrinsic factor deficiency: Secondary | ICD-10-CM | POA: Diagnosis not present

## 2018-08-05 DIAGNOSIS — F411 Generalized anxiety disorder: Secondary | ICD-10-CM | POA: Diagnosis not present

## 2018-08-05 DIAGNOSIS — E782 Mixed hyperlipidemia: Secondary | ICD-10-CM | POA: Diagnosis not present

## 2018-08-05 DIAGNOSIS — E559 Vitamin D deficiency, unspecified: Secondary | ICD-10-CM | POA: Diagnosis not present

## 2018-08-05 DIAGNOSIS — F331 Major depressive disorder, recurrent, moderate: Secondary | ICD-10-CM | POA: Diagnosis not present

## 2018-09-16 ENCOUNTER — Other Ambulatory Visit: Payer: Self-pay

## 2018-11-01 DIAGNOSIS — Z23 Encounter for immunization: Secondary | ICD-10-CM | POA: Diagnosis not present

## 2018-11-09 DIAGNOSIS — N390 Urinary tract infection, site not specified: Secondary | ICD-10-CM | POA: Diagnosis not present

## 2018-11-15 DIAGNOSIS — N390 Urinary tract infection, site not specified: Secondary | ICD-10-CM | POA: Diagnosis not present

## 2018-11-16 DIAGNOSIS — N390 Urinary tract infection, site not specified: Secondary | ICD-10-CM | POA: Diagnosis not present

## 2018-11-21 DIAGNOSIS — Z0000 Encounter for general adult medical examination without abnormal findings: Secondary | ICD-10-CM | POA: Diagnosis not present

## 2018-11-21 DIAGNOSIS — N390 Urinary tract infection, site not specified: Secondary | ICD-10-CM | POA: Diagnosis not present

## 2018-12-08 DIAGNOSIS — D220 Melanocytic nevi of lip: Secondary | ICD-10-CM | POA: Diagnosis not present

## 2018-12-08 DIAGNOSIS — D1801 Hemangioma of skin and subcutaneous tissue: Secondary | ICD-10-CM | POA: Diagnosis not present

## 2018-12-08 DIAGNOSIS — L710 Perioral dermatitis: Secondary | ICD-10-CM | POA: Diagnosis not present

## 2018-12-08 DIAGNOSIS — D225 Melanocytic nevi of trunk: Secondary | ICD-10-CM | POA: Diagnosis not present

## 2018-12-08 DIAGNOSIS — D2272 Melanocytic nevi of left lower limb, including hip: Secondary | ICD-10-CM | POA: Diagnosis not present

## 2018-12-09 DIAGNOSIS — F411 Generalized anxiety disorder: Secondary | ICD-10-CM | POA: Diagnosis not present

## 2018-12-09 DIAGNOSIS — F331 Major depressive disorder, recurrent, moderate: Secondary | ICD-10-CM | POA: Diagnosis not present

## 2018-12-09 DIAGNOSIS — K219 Gastro-esophageal reflux disease without esophagitis: Secondary | ICD-10-CM | POA: Diagnosis not present

## 2018-12-29 DIAGNOSIS — E782 Mixed hyperlipidemia: Secondary | ICD-10-CM | POA: Diagnosis not present

## 2018-12-29 DIAGNOSIS — F331 Major depressive disorder, recurrent, moderate: Secondary | ICD-10-CM | POA: Diagnosis not present

## 2018-12-29 DIAGNOSIS — F411 Generalized anxiety disorder: Secondary | ICD-10-CM | POA: Diagnosis not present

## 2018-12-29 DIAGNOSIS — K219 Gastro-esophageal reflux disease without esophagitis: Secondary | ICD-10-CM | POA: Diagnosis not present

## 2019-01-30 DIAGNOSIS — D510 Vitamin B12 deficiency anemia due to intrinsic factor deficiency: Secondary | ICD-10-CM | POA: Diagnosis not present

## 2019-01-30 DIAGNOSIS — Z6825 Body mass index (BMI) 25.0-25.9, adult: Secondary | ICD-10-CM | POA: Diagnosis not present

## 2019-01-30 DIAGNOSIS — E559 Vitamin D deficiency, unspecified: Secondary | ICD-10-CM | POA: Diagnosis not present

## 2019-01-30 DIAGNOSIS — E782 Mixed hyperlipidemia: Secondary | ICD-10-CM | POA: Diagnosis not present

## 2019-01-31 DIAGNOSIS — Z23 Encounter for immunization: Secondary | ICD-10-CM | POA: Diagnosis not present

## 2019-03-18 ENCOUNTER — Ambulatory Visit: Payer: Medicare Other

## 2019-03-29 ENCOUNTER — Ambulatory Visit: Payer: Medicare Other

## 2019-06-07 ENCOUNTER — Other Ambulatory Visit: Payer: Self-pay | Admitting: Family Medicine

## 2019-06-07 DIAGNOSIS — M549 Dorsalgia, unspecified: Secondary | ICD-10-CM | POA: Diagnosis not present

## 2019-06-07 DIAGNOSIS — M542 Cervicalgia: Secondary | ICD-10-CM | POA: Diagnosis not present

## 2019-06-07 DIAGNOSIS — M170 Bilateral primary osteoarthritis of knee: Secondary | ICD-10-CM | POA: Diagnosis not present

## 2019-06-30 DIAGNOSIS — N390 Urinary tract infection, site not specified: Secondary | ICD-10-CM | POA: Diagnosis not present

## 2019-07-24 ENCOUNTER — Other Ambulatory Visit: Payer: Self-pay | Admitting: Family Medicine

## 2019-07-24 ENCOUNTER — Ambulatory Visit
Admission: RE | Admit: 2019-07-24 | Discharge: 2019-07-24 | Disposition: A | Discharge status: Home / Self Care | Payer: Medicare Other | Source: Ambulatory Visit | Attending: Family Medicine | Admitting: Family Medicine

## 2019-07-24 DIAGNOSIS — M25561 Pain in right knee: Secondary | ICD-10-CM

## 2019-07-24 DIAGNOSIS — M25562 Pain in left knee: Secondary | ICD-10-CM

## 2019-07-25 DIAGNOSIS — D649 Anemia, unspecified: Secondary | ICD-10-CM | POA: Diagnosis not present

## 2019-07-25 DIAGNOSIS — E539 Vitamin B deficiency, unspecified: Secondary | ICD-10-CM | POA: Diagnosis not present

## 2019-07-25 DIAGNOSIS — E559 Vitamin D deficiency, unspecified: Secondary | ICD-10-CM | POA: Diagnosis not present

## 2019-07-25 DIAGNOSIS — E785 Hyperlipidemia, unspecified: Secondary | ICD-10-CM | POA: Diagnosis not present

## 2019-07-31 DIAGNOSIS — E782 Mixed hyperlipidemia: Secondary | ICD-10-CM | POA: Diagnosis not present

## 2019-07-31 DIAGNOSIS — E559 Vitamin D deficiency, unspecified: Secondary | ICD-10-CM | POA: Diagnosis not present

## 2019-07-31 DIAGNOSIS — D519 Vitamin B12 deficiency anemia, unspecified: Secondary | ICD-10-CM | POA: Diagnosis not present

## 2019-09-04 DIAGNOSIS — H524 Presbyopia: Secondary | ICD-10-CM | POA: Diagnosis not present

## 2019-09-04 DIAGNOSIS — H35033 Hypertensive retinopathy, bilateral: Secondary | ICD-10-CM | POA: Diagnosis not present

## 2019-09-04 DIAGNOSIS — H2513 Age-related nuclear cataract, bilateral: Secondary | ICD-10-CM | POA: Diagnosis not present

## 2019-09-04 DIAGNOSIS — H5213 Myopia, bilateral: Secondary | ICD-10-CM | POA: Diagnosis not present

## 2019-09-04 DIAGNOSIS — I10 Essential (primary) hypertension: Secondary | ICD-10-CM | POA: Diagnosis not present

## 2019-09-04 DIAGNOSIS — H52222 Regular astigmatism, left eye: Secondary | ICD-10-CM | POA: Diagnosis not present

## 2019-10-02 IMAGING — CT CT CERVICAL SPINE W/O CM
3 of 7 series · 12 of 33 positions shown, 13 images · non-contrast
Comparison: None.

ADDENDUM:
Voice recognition error: The second sentence of the impressions
section should read "No acute trauma to the head. "
CLINICAL DATA: Tripped and fell yesterday. Hit head on wall. Head
pain and blurred vision to the right eye. Headache. Not feeling
normal. Head trauma, minor, GCS greater than 13.

EXAM:
CT HEAD WITHOUT CONTRAST
CT CERVICAL SPINE WITHOUT CONTRAST
TECHNIQUE: Multidetector CT imaging of the head and cervical spine was
performed following the standard protocol without intravenous
contrast. Multiplanar CT image reconstructions of the cervical spine
were also generated.

[Series 4: head 3.0 mpr cor · coronal · 0.32mm/px · 3 of 64 slices shown]
[im 16/64  bone]
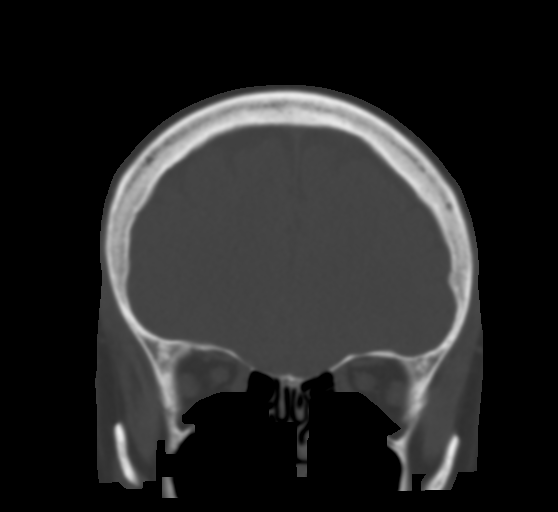
[im 32/64  bone]
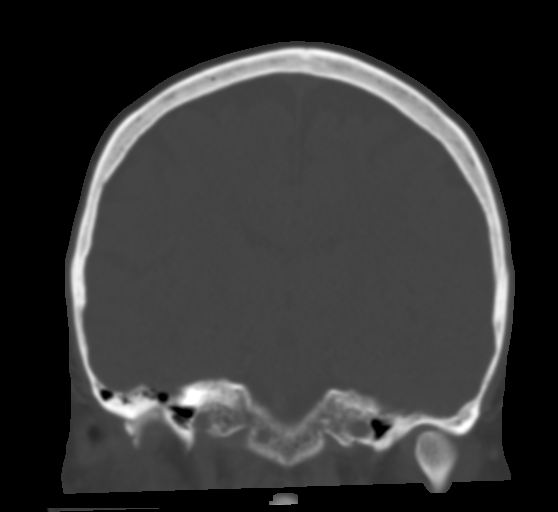
[im 48/64  bone]
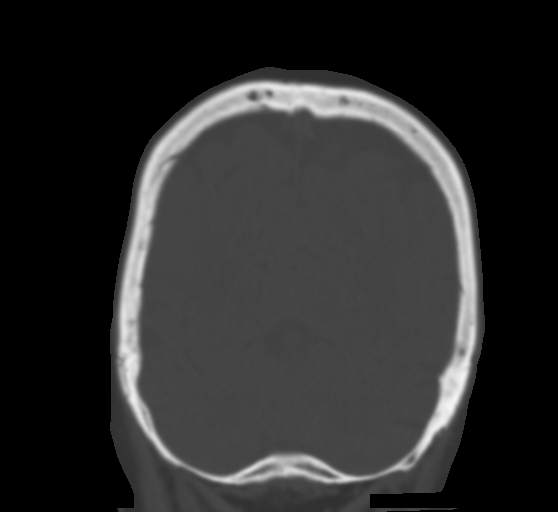

[Series 10: sagittals · sagittal · 0.24mm/px · 5 of 70 slices shown]
[im 12/70  bone]
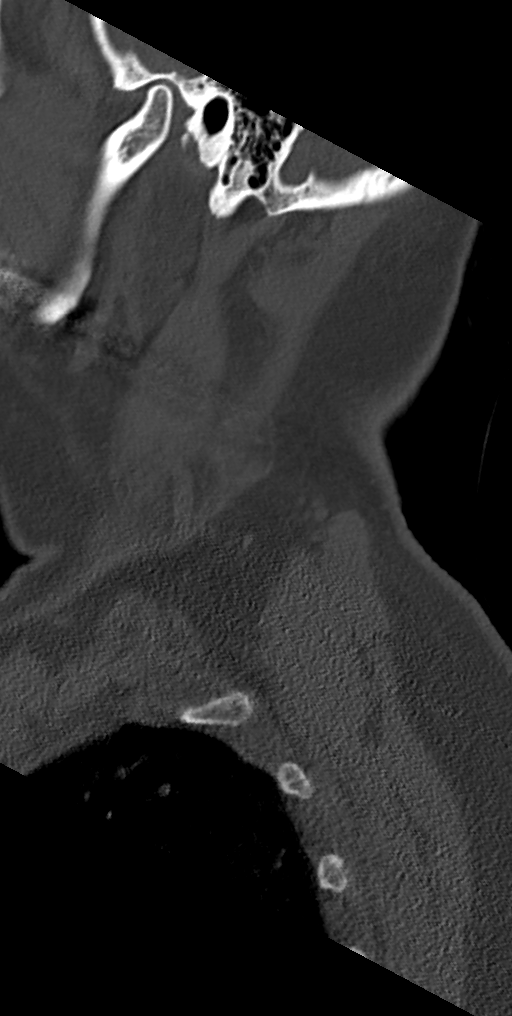
[im 24/70  bone]
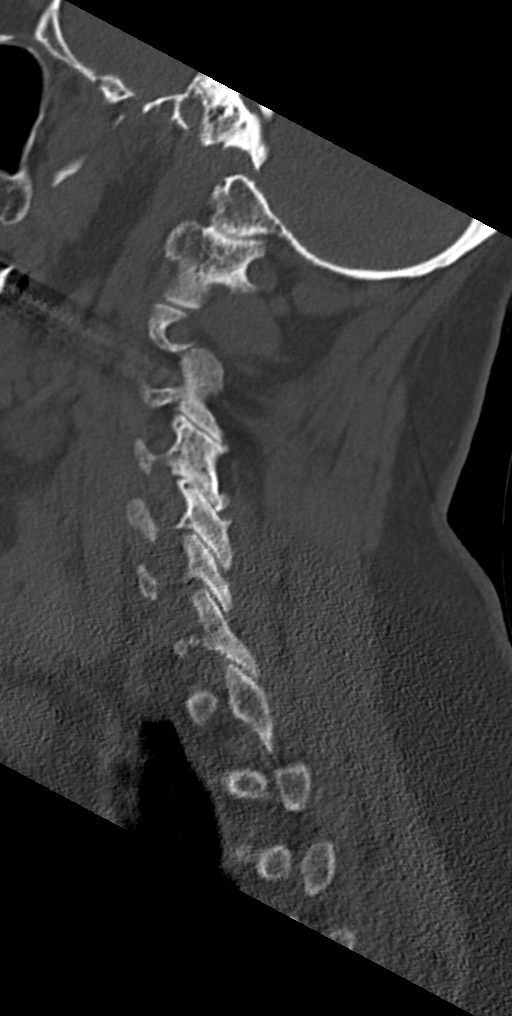
[im 35/70  bone]
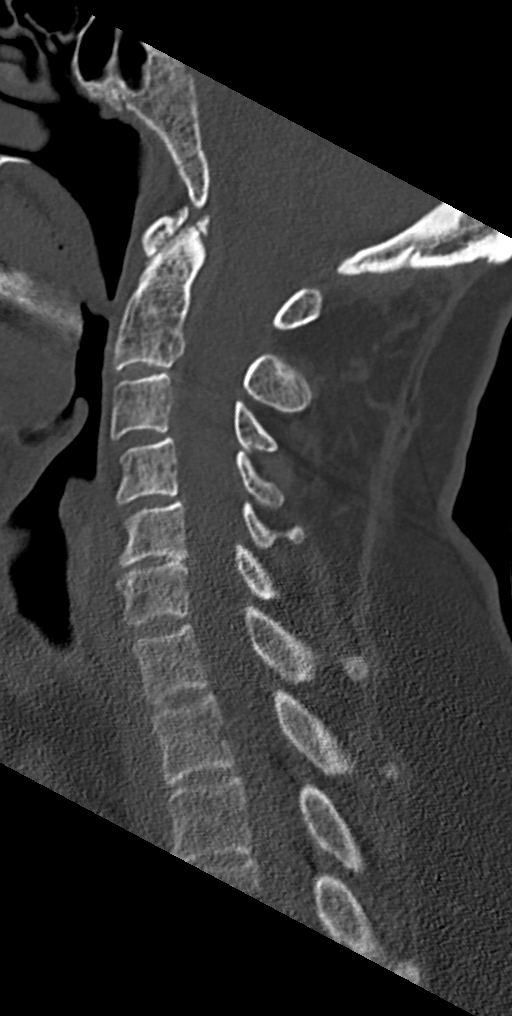
[im 47/70  bone]
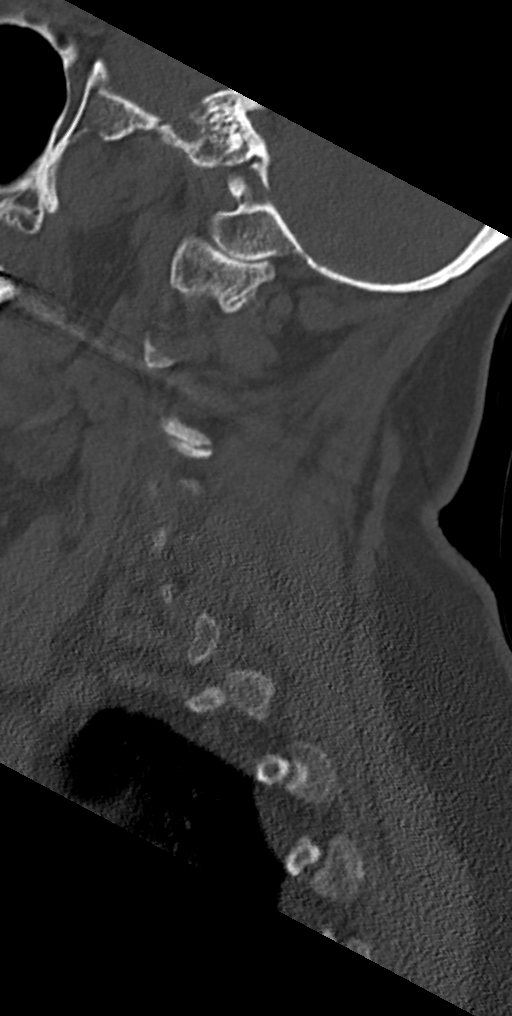
[im 58/70  bone]
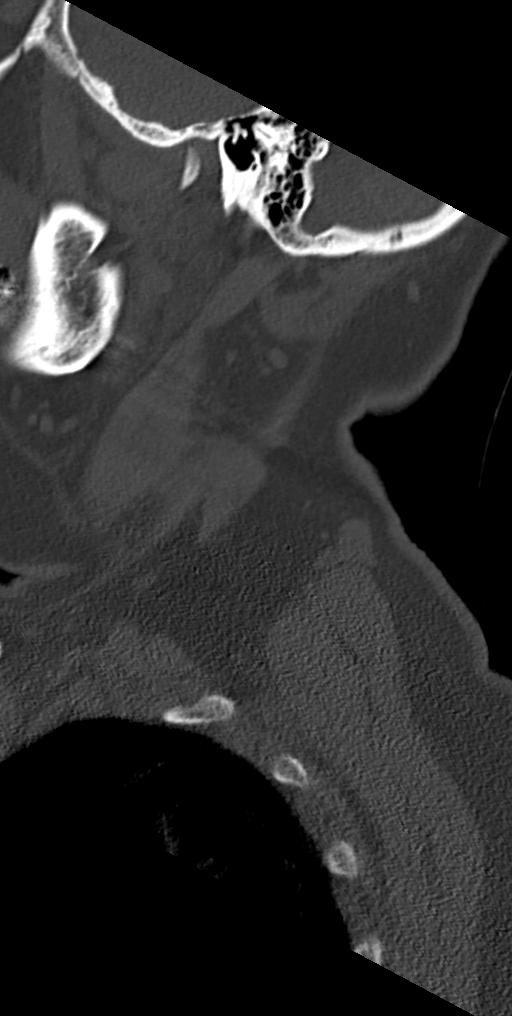

[Series 11: orthogonals · axial · 0.23mm/px · z∈[+475,+596]mm · 4 of 104 slices shown, 5 images]
[im 18/104  soft-tissue]
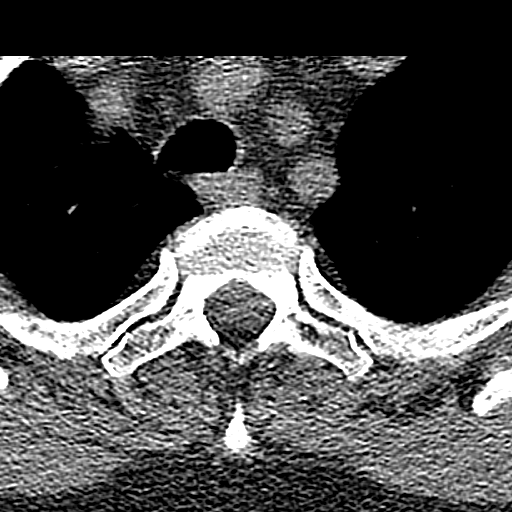
[im 18/104  bone]
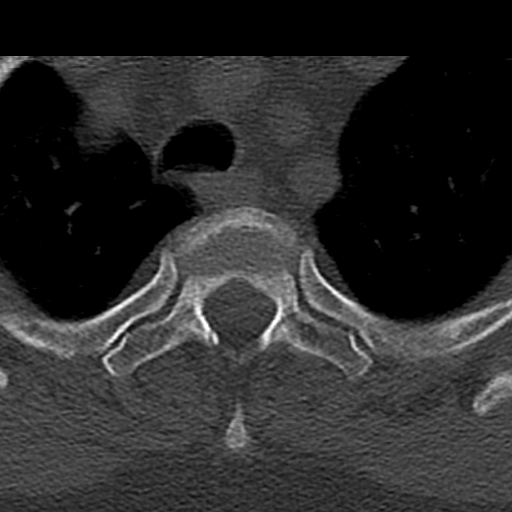
[im 35/104  bone]
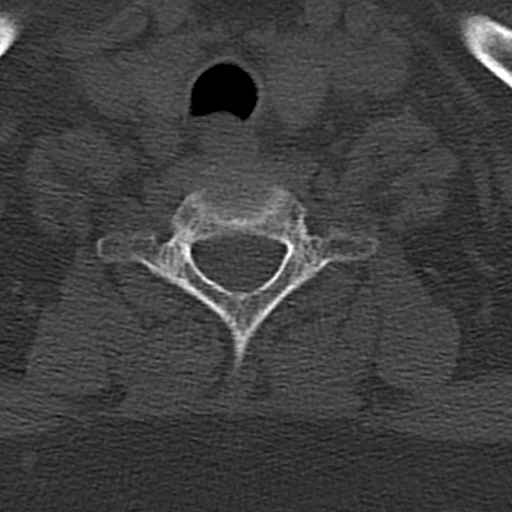
[im 69/104  bone]
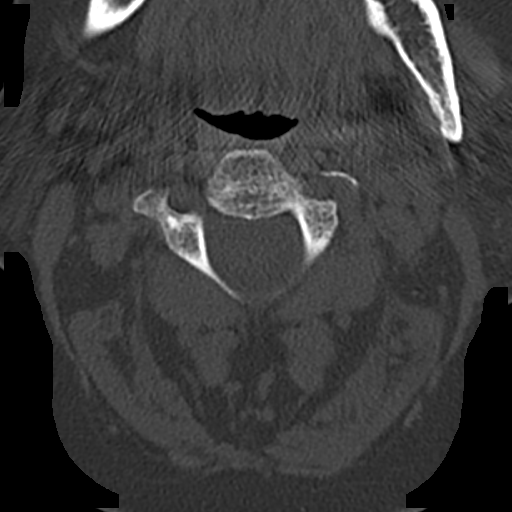
[im 86/104  bone]
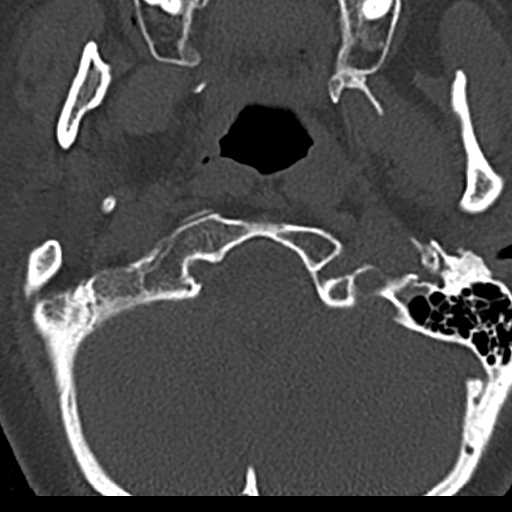

[12 of 33 positions shown; findings below may reference images not displayed]

FINDINGS: CT HEAD FINDINGS

Brain: No acute infarct, hemorrhage, or mass lesion is present. The
ventricles are of normal size. No significant extraaxial fluid
collection is present.

No significant white matter disease is present. Brainstem and
cerebellum are normal.

Vascular: Mild atherosclerotic calcifications are present in the
cavernous internal carotid arteries bilaterally and at the left
vertebral artery dural margin.

Skull: Calvarium is intact. No focal lytic or blastic lesions are
present. No significant extracranial soft tissue injury is present.
There is no he acute or healing fracture.

Sinuses/Orbits: The paranasal sinuses and mastoid air cells are
clear. Globes and orbits are within normal limits.

CT CERVICAL SPINE FINDINGS

Alignment: Slight degenerative retrolisthesis is present at C5-6.
Slight degenerative anterolisthesis is present at C3-4 and C4-5.

Skull base and vertebrae: The craniocervical junction is within
normal limits. Vertebral body heights are maintained. No acute or
healing fractures are present.

Soft tissues and spinal canal: No prevertebral fluid or swelling. No
visible canal hematoma.

Disc levels: Uncovertebral and facet hypertrophy is present
bilaterally at C5-6. Facet hypertrophy contributes to mild foraminal
narrowing at C3-4, left greater than right. Mild foraminal narrowing
is present bilaterally at C4-5 due to facet spurring.

Upper chest: The lung apices are clear. The thoracic inlet is within
normal limits.
IMPRESSION: 1. Normal CT appearance of the brain.
2. Acute trauma to the head.
3. Mild degenerative changes of the upper cervical spine from C3-4
through C5-6 as described.
4. No acute fracture or traumatic subluxation in the cervical spine.

## 2019-10-25 LAB — HM DEXA SCAN

## 2019-10-26 DIAGNOSIS — E782 Mixed hyperlipidemia: Secondary | ICD-10-CM | POA: Diagnosis not present

## 2019-10-26 DIAGNOSIS — R5383 Other fatigue: Secondary | ICD-10-CM | POA: Diagnosis not present

## 2019-10-26 DIAGNOSIS — I10 Essential (primary) hypertension: Secondary | ICD-10-CM | POA: Diagnosis not present

## 2019-10-31 DIAGNOSIS — F331 Major depressive disorder, recurrent, moderate: Secondary | ICD-10-CM | POA: Diagnosis not present

## 2019-10-31 DIAGNOSIS — I10 Essential (primary) hypertension: Secondary | ICD-10-CM | POA: Diagnosis not present

## 2019-10-31 DIAGNOSIS — F411 Generalized anxiety disorder: Secondary | ICD-10-CM | POA: Diagnosis not present

## 2019-10-31 DIAGNOSIS — M1990 Unspecified osteoarthritis, unspecified site: Secondary | ICD-10-CM | POA: Diagnosis not present

## 2019-10-31 DIAGNOSIS — G4700 Insomnia, unspecified: Secondary | ICD-10-CM | POA: Diagnosis not present

## 2019-10-31 DIAGNOSIS — E782 Mixed hyperlipidemia: Secondary | ICD-10-CM | POA: Diagnosis not present

## 2019-10-31 DIAGNOSIS — E1169 Type 2 diabetes mellitus with other specified complication: Secondary | ICD-10-CM | POA: Diagnosis not present

## 2019-11-08 DIAGNOSIS — Z1231 Encounter for screening mammogram for malignant neoplasm of breast: Secondary | ICD-10-CM | POA: Diagnosis not present

## 2019-11-08 DIAGNOSIS — M85852 Other specified disorders of bone density and structure, left thigh: Secondary | ICD-10-CM | POA: Diagnosis not present

## 2019-11-08 DIAGNOSIS — M85851 Other specified disorders of bone density and structure, right thigh: Secondary | ICD-10-CM | POA: Diagnosis not present

## 2019-11-14 DIAGNOSIS — M8580 Other specified disorders of bone density and structure, unspecified site: Secondary | ICD-10-CM

## 2019-11-14 DIAGNOSIS — Z23 Encounter for immunization: Secondary | ICD-10-CM | POA: Diagnosis not present

## 2019-11-14 HISTORY — DX: Other specified disorders of bone density and structure, unspecified site: M85.80

## 2019-11-14 LAB — HM MAMMOGRAPHY: HM Mammogram: NORMAL (ref 0–4)

## 2019-11-15 DIAGNOSIS — L853 Xerosis cutis: Secondary | ICD-10-CM | POA: Diagnosis not present

## 2019-11-15 DIAGNOSIS — E559 Vitamin D deficiency, unspecified: Secondary | ICD-10-CM | POA: Diagnosis not present

## 2019-11-15 DIAGNOSIS — M8580 Other specified disorders of bone density and structure, unspecified site: Secondary | ICD-10-CM | POA: Diagnosis not present

## 2019-11-23 DIAGNOSIS — Z0000 Encounter for general adult medical examination without abnormal findings: Secondary | ICD-10-CM | POA: Diagnosis not present

## 2019-11-23 DIAGNOSIS — Z1339 Encounter for screening examination for other mental health and behavioral disorders: Secondary | ICD-10-CM | POA: Diagnosis not present

## 2019-11-23 DIAGNOSIS — Z1331 Encounter for screening for depression: Secondary | ICD-10-CM | POA: Diagnosis not present

## 2019-11-23 DIAGNOSIS — I10 Essential (primary) hypertension: Secondary | ICD-10-CM | POA: Diagnosis not present

## 2019-11-23 DIAGNOSIS — G4700 Insomnia, unspecified: Secondary | ICD-10-CM | POA: Diagnosis not present

## 2019-11-23 DIAGNOSIS — E663 Overweight: Secondary | ICD-10-CM | POA: Diagnosis not present

## 2019-11-23 DIAGNOSIS — F411 Generalized anxiety disorder: Secondary | ICD-10-CM | POA: Diagnosis not present

## 2019-11-23 DIAGNOSIS — D519 Vitamin B12 deficiency anemia, unspecified: Secondary | ICD-10-CM | POA: Diagnosis not present

## 2019-11-23 DIAGNOSIS — F331 Major depressive disorder, recurrent, moderate: Secondary | ICD-10-CM | POA: Diagnosis not present

## 2019-12-05 DIAGNOSIS — Z23 Encounter for immunization: Secondary | ICD-10-CM | POA: Diagnosis not present

## 2020-01-22 DIAGNOSIS — H5213 Myopia, bilateral: Secondary | ICD-10-CM | POA: Diagnosis not present

## 2020-01-22 DIAGNOSIS — H52222 Regular astigmatism, left eye: Secondary | ICD-10-CM | POA: Diagnosis not present

## 2020-01-22 DIAGNOSIS — H524 Presbyopia: Secondary | ICD-10-CM | POA: Diagnosis not present

## 2020-01-22 DIAGNOSIS — H16142 Punctate keratitis, left eye: Secondary | ICD-10-CM | POA: Diagnosis not present

## 2020-01-22 DIAGNOSIS — H11122 Conjunctival concretions, left eye: Secondary | ICD-10-CM | POA: Diagnosis not present

## 2020-01-22 DIAGNOSIS — T1512XA Foreign body in conjunctival sac, left eye, initial encounter: Secondary | ICD-10-CM | POA: Diagnosis not present

## 2020-03-15 DIAGNOSIS — F411 Generalized anxiety disorder: Secondary | ICD-10-CM | POA: Diagnosis not present

## 2020-03-15 DIAGNOSIS — F410 Panic disorder [episodic paroxysmal anxiety] without agoraphobia: Secondary | ICD-10-CM | POA: Diagnosis not present

## 2020-03-15 DIAGNOSIS — I10 Essential (primary) hypertension: Secondary | ICD-10-CM | POA: Diagnosis not present

## 2020-03-20 DIAGNOSIS — I10 Essential (primary) hypertension: Secondary | ICD-10-CM | POA: Diagnosis not present

## 2020-03-20 DIAGNOSIS — F331 Major depressive disorder, recurrent, moderate: Secondary | ICD-10-CM | POA: Diagnosis not present

## 2020-03-20 DIAGNOSIS — F411 Generalized anxiety disorder: Secondary | ICD-10-CM | POA: Diagnosis not present

## 2020-03-28 DIAGNOSIS — Z719 Counseling, unspecified: Secondary | ICD-10-CM | POA: Diagnosis not present

## 2020-03-28 DIAGNOSIS — F411 Generalized anxiety disorder: Secondary | ICD-10-CM | POA: Diagnosis not present

## 2020-03-28 DIAGNOSIS — I10 Essential (primary) hypertension: Secondary | ICD-10-CM | POA: Diagnosis not present

## 2020-04-10 DIAGNOSIS — I10 Essential (primary) hypertension: Secondary | ICD-10-CM | POA: Diagnosis not present

## 2020-04-10 DIAGNOSIS — F411 Generalized anxiety disorder: Secondary | ICD-10-CM | POA: Diagnosis not present

## 2020-04-10 DIAGNOSIS — R5383 Other fatigue: Secondary | ICD-10-CM | POA: Diagnosis not present

## 2020-04-24 DIAGNOSIS — I10 Essential (primary) hypertension: Secondary | ICD-10-CM | POA: Diagnosis not present

## 2020-04-24 DIAGNOSIS — E782 Mixed hyperlipidemia: Secondary | ICD-10-CM | POA: Diagnosis not present

## 2020-04-24 DIAGNOSIS — E559 Vitamin D deficiency, unspecified: Secondary | ICD-10-CM | POA: Diagnosis not present

## 2020-04-29 DIAGNOSIS — F411 Generalized anxiety disorder: Secondary | ICD-10-CM | POA: Diagnosis not present

## 2020-04-29 DIAGNOSIS — E782 Mixed hyperlipidemia: Secondary | ICD-10-CM | POA: Diagnosis not present

## 2020-04-29 DIAGNOSIS — I10 Essential (primary) hypertension: Secondary | ICD-10-CM | POA: Diagnosis not present

## 2020-04-29 DIAGNOSIS — E559 Vitamin D deficiency, unspecified: Secondary | ICD-10-CM | POA: Diagnosis not present

## 2020-04-29 DIAGNOSIS — R7989 Other specified abnormal findings of blood chemistry: Secondary | ICD-10-CM | POA: Diagnosis not present

## 2020-04-29 DIAGNOSIS — K219 Gastro-esophageal reflux disease without esophagitis: Secondary | ICD-10-CM | POA: Diagnosis not present

## 2020-06-12 DIAGNOSIS — I10 Essential (primary) hypertension: Secondary | ICD-10-CM | POA: Diagnosis not present

## 2020-06-12 DIAGNOSIS — R5383 Other fatigue: Secondary | ICD-10-CM | POA: Diagnosis not present

## 2020-06-12 DIAGNOSIS — E782 Mixed hyperlipidemia: Secondary | ICD-10-CM | POA: Diagnosis not present

## 2020-06-28 DIAGNOSIS — Z789 Other specified health status: Secondary | ICD-10-CM | POA: Diagnosis not present

## 2020-06-28 DIAGNOSIS — I10 Essential (primary) hypertension: Secondary | ICD-10-CM | POA: Diagnosis not present

## 2020-06-28 DIAGNOSIS — Z23 Encounter for immunization: Secondary | ICD-10-CM | POA: Diagnosis not present

## 2020-06-28 DIAGNOSIS — R5383 Other fatigue: Secondary | ICD-10-CM | POA: Diagnosis not present

## 2020-06-28 DIAGNOSIS — F411 Generalized anxiety disorder: Secondary | ICD-10-CM | POA: Diagnosis not present

## 2020-06-28 DIAGNOSIS — E782 Mixed hyperlipidemia: Secondary | ICD-10-CM | POA: Diagnosis not present

## 2020-06-28 DIAGNOSIS — Z7185 Encounter for immunization safety counseling: Secondary | ICD-10-CM | POA: Diagnosis not present

## 2020-09-15 ENCOUNTER — Telehealth: Payer: Medicare Other | Admitting: Physician Assistant

## 2020-09-15 ENCOUNTER — Encounter: Payer: Self-pay | Admitting: Physician Assistant

## 2020-09-15 DIAGNOSIS — N3000 Acute cystitis without hematuria: Secondary | ICD-10-CM

## 2020-09-15 NOTE — Progress Notes (Signed)
Virtual Visit via Video Note  I connected with Jade Williams on 09/15/20 at 12:30 PM EDT by a video enabled telemedicine application and verified that I am speaking with the correct person using two identifiers.  Location: Patient: patient' home Provider: provider's office   I discussed the limitations of evaluation and management by telemedicine and the availability of in person appointments. The patient expressed understanding and agreed to proceed.    I discussed the assessment and treatment plan with the patient. The patient was provided an opportunity to ask questions and all were answered. The patient agreed with the plan and demonstrated an understanding of the instructions.   The patient was advised to call back or seek an in-person evaluation if the symptoms worsen or if the condition fails to improve as anticipated.  I provided 15 minutes of non-face-to-face time during this encounter.   Waldon Merl, PA-C     Subjective:    Patient ID: Jade Williams, female    DOB: 03/13/1950, 70 y.o.   MRN: [id number]  No chief complaint on file.   70 yo F in NAD A and O x 3,  connects via video for urinary pressure x 5 days. States got a home UTI test and it showed positive for UTI. Denies any confusion, fever, chills, cp, dyspnea abd pain, n, v, dysuria, urinary urgency, frequency, back pain, genital rash, vaginal discharge. Denies any recent hospitalization/catheter use/ or living in congregate setting.  Other Pertinent negatives include no abdominal pain, arthralgias, chest pain, chills, diaphoresis, fatigue, fever, nausea, rash or vomiting.  Patient is in today for urinary pressure x 5 days.  Past Medical History:  Diagnosis Date   Hyperlipidemia    Pernicious anemia     Past Surgical History:  Procedure Laterality Date   APPENDECTOMY     TONSILLECTOMY      No family history on file.  Social History   Socioeconomic History   Marital status: Single     Spouse name: Not on file   Number of children: Not on file   Years of education: Not on file   Highest education level: Not on file  Occupational History   Not on file  Tobacco Use   Smoking status: Never   Smokeless tobacco: Never  Vaping Use   Vaping Use: Never used  Substance and Sexual Activity   Alcohol use: Yes    Comment: ocassional wine   Drug use: No   Sexual activity: Not on file  Other Topics Concern   Not on file  Social History Narrative   Not on file   Social Determinants of Health   Financial Resource Strain: Not on file  Food Insecurity: Not on file  Transportation Needs: Not on file  Physical Activity: Not on file  Stress: Not on file  Social Connections: Not on file  Intimate Partner Violence: Not on file    Outpatient Medications Prior to Visit  Medication Sig Dispense Refill   atorvastatin (LIPITOR) 10 MG tablet Take 10 mg by mouth daily.     calcium carbonate (CALCIUM 500) 1250 MG tablet Take 2 tablets by mouth daily.     cholecalciferol (VITAMIN D) 1000 UNITS tablet Take 5,000 Units by mouth daily.      Coenzyme Q10 (CO Q 10 PO) Take by mouth.     COLLAGEN PO Take by mouth.     cyanocobalamin (,VITAMIN B-12,) 1000 MCG/ML injection INJECT 1 ML INTRAMUSCULARLY EVERY 30 DAYS     FLUoxetine (PROZAC) 20  MG tablet Take 20 mg by mouth daily.      multivitamin-lutein (OCUVITE-LUTEIN) CAPS capsule Take 1 capsule by mouth daily.     OVER THE COUNTER MEDICATION Take by mouth daily. tumeric     OVER THE COUNTER MEDICATION Take by mouth daily. Emercency=1 packet per day     vitamin E 400 UNIT capsule Take 400 Units by mouth daily.     No facility-administered medications prior to visit.    No Known Allergies  Review of Systems  Constitutional:  Negative for activity change, appetite change, chills, diaphoresis, fatigue and fever.  Respiratory:  Negative for chest tightness and shortness of breath.   Cardiovascular:  Negative for chest pain.   Gastrointestinal:  Negative for abdominal pain, diarrhea, nausea and vomiting.  Genitourinary:  Negative for decreased urine volume, difficulty urinating, dysuria, enuresis, flank pain, frequency, genital sores, hematuria and urgency.       Positive for urinary pressure  Musculoskeletal:  Negative for arthralgias and back pain.  Skin:  Negative for rash.  Hematological:  Negative for adenopathy.  Psychiatric/Behavioral:  Negative for agitation, behavioral problems and confusion.       Objective:    Physical Exam Constitutional:      General: She is not in acute distress.    Appearance: Normal appearance. She is not ill-appearing, toxic-appearing or diaphoretic.  Pulmonary:     Effort: No respiratory distress.  Neurological:     Mental Status: She is alert and oriented to person, place, and time.  Psychiatric:        Mood and Affect: Mood normal.        Behavior: Behavior normal.        Thought Content: Thought content normal.        Judgment: Judgment normal.    There were no vitals taken for this visit. Wt Readings from Last 3 Encounters:  04/04/18 155 lb (70.3 kg)  03/14/18 155 lb 9.6 oz (70.6 kg)  05/09/17 160 lb (72.6 kg)    Health Maintenance Due  Topic Date Due   COVID-19 Vaccine (1) Never done   URINE MICROALBUMIN  Never done   Hepatitis C Screening  Never done   Zoster Vaccines- Shingrix (1 of 2) Never done   COLONOSCOPY (Pts 45-25yr Insurance coverage will need to be confirmed)  Never done   MAMMOGRAM  Never done   DEXA SCAN  Never done   PNA vac Low Risk Adult (1 of 2 - PCV13) Never done    There are no preventive care reminders to display for this patient.   No results found for: TSH No results found for: WBC, HGB, HCT, MCV, PLT No results found for: NA, K, CHLORIDE, CO2, GLUCOSE, BUN, CREATININE, BILITOT, ALKPHOS, AST, ALT, PROT, ALBUMIN, CALCIUM, ANIONGAP, EGFR, GFR No results found for: CHOL No results found for: HDL No results found for:  LDLCALC No results found for: TRIG No results found for: CHOLHDL No results found for: HGBA1C     Assessment & Plan:   Problem List Items Addressed This Visit   None Visit Diagnoses     Acute cystitis without hematuria    -  Primary        No orders of the defined types were placed in this encounter. Advised.  patient to have face to face visit for further evaluation to ensure proper diagnosis and treatment and to order appropriate labs such as urine culture.  SWaldon Merl PA-C

## 2020-10-04 DIAGNOSIS — Z0001 Encounter for general adult medical examination with abnormal findings: Secondary | ICD-10-CM | POA: Diagnosis not present

## 2020-10-04 DIAGNOSIS — Z0000 Encounter for general adult medical examination without abnormal findings: Secondary | ICD-10-CM | POA: Diagnosis not present

## 2020-10-20 ENCOUNTER — Encounter (HOSPITAL_BASED_OUTPATIENT_CLINIC_OR_DEPARTMENT_OTHER): Payer: Self-pay

## 2020-10-20 ENCOUNTER — Emergency Department (HOSPITAL_BASED_OUTPATIENT_CLINIC_OR_DEPARTMENT_OTHER): Payer: Medicare Other

## 2020-10-20 ENCOUNTER — Emergency Department (HOSPITAL_BASED_OUTPATIENT_CLINIC_OR_DEPARTMENT_OTHER)
Admission: EM | Admit: 2020-10-20 | Discharge: 2020-10-20 | Disposition: A | Discharge status: Home / Self Care | Payer: Medicare Other | Attending: Emergency Medicine | Admitting: Emergency Medicine

## 2020-10-20 ENCOUNTER — Other Ambulatory Visit: Payer: Self-pay

## 2020-10-20 DIAGNOSIS — K449 Diaphragmatic hernia without obstruction or gangrene: Secondary | ICD-10-CM | POA: Diagnosis not present

## 2020-10-20 DIAGNOSIS — K429 Umbilical hernia without obstruction or gangrene: Secondary | ICD-10-CM | POA: Diagnosis not present

## 2020-10-20 DIAGNOSIS — M546 Pain in thoracic spine: Secondary | ICD-10-CM | POA: Insufficient documentation

## 2020-10-20 DIAGNOSIS — M5136 Other intervertebral disc degeneration, lumbar region: Secondary | ICD-10-CM | POA: Diagnosis not present

## 2020-10-20 DIAGNOSIS — I10 Essential (primary) hypertension: Secondary | ICD-10-CM | POA: Diagnosis not present

## 2020-10-20 DIAGNOSIS — N281 Cyst of kidney, acquired: Secondary | ICD-10-CM | POA: Diagnosis not present

## 2020-10-20 DIAGNOSIS — R0789 Other chest pain: Secondary | ICD-10-CM | POA: Insufficient documentation

## 2020-10-20 DIAGNOSIS — J9811 Atelectasis: Secondary | ICD-10-CM | POA: Diagnosis not present

## 2020-10-20 DIAGNOSIS — M47814 Spondylosis without myelopathy or radiculopathy, thoracic region: Secondary | ICD-10-CM | POA: Diagnosis not present

## 2020-10-20 DIAGNOSIS — R1013 Epigastric pain: Secondary | ICD-10-CM | POA: Diagnosis present

## 2020-10-20 HISTORY — DX: Essential (primary) hypertension: I10

## 2020-10-20 LAB — CBC
HCT: 37.9 % (ref 36.0–46.0)
Hemoglobin: 12.7 g/dL (ref 12.0–15.0)
MCH: 29.1 pg (ref 26.0–34.0)
MCHC: 33.5 g/dL (ref 30.0–36.0)
MCV: 86.7 fL (ref 80.0–100.0)
Platelets: 223 10*3/uL (ref 150–400)
RBC: 4.37 MIL/uL (ref 3.87–5.11)
RDW: 13.2 % (ref 11.5–15.5)
WBC: 10.7 10*3/uL — ABNORMAL HIGH (ref 4.0–10.5)
nRBC: 0 % (ref 0.0–0.2)

## 2020-10-20 LAB — URINALYSIS, ROUTINE W REFLEX MICROSCOPIC
Bilirubin Urine: NEGATIVE
Glucose, UA: NEGATIVE mg/dL
Hgb urine dipstick: NEGATIVE
Ketones, ur: NEGATIVE mg/dL
Leukocytes,Ua: NEGATIVE
Nitrite: NEGATIVE
Protein, ur: NEGATIVE mg/dL
Specific Gravity, Urine: 1.02 (ref 1.005–1.030)
pH: 7.5 (ref 5.0–8.0)

## 2020-10-20 LAB — COMPREHENSIVE METABOLIC PANEL
ALT: 21 U/L (ref 0–44)
AST: 23 U/L (ref 15–41)
Albumin: 3.7 g/dL (ref 3.5–5.0)
Alkaline Phosphatase: 100 U/L (ref 38–126)
Anion gap: 7 (ref 5–15)
BUN: 18 mg/dL (ref 8–23)
CO2: 30 mmol/L (ref 22–32)
Calcium: 9.2 mg/dL (ref 8.9–10.3)
Chloride: 102 mmol/L (ref 98–111)
Creatinine, Ser: 1.09 mg/dL — ABNORMAL HIGH (ref 0.44–1.00)
GFR, Estimated: 54 mL/min — ABNORMAL LOW (ref >60)
Glucose, Bld: 108 mg/dL — ABNORMAL HIGH (ref 70–99)
Potassium: 4.5 mmol/L (ref 3.5–5.1)
Sodium: 139 mmol/L (ref 135–145)
Total Bilirubin: 0.4 mg/dL (ref 0.3–1.2)
Total Protein: 6.7 g/dL (ref 6.5–8.1)

## 2020-10-20 LAB — TROPONIN I (HIGH SENSITIVITY)
Troponin I (High Sensitivity): 5 ng/L (ref <18)
Troponin I (High Sensitivity): 6 ng/L (ref <18)

## 2020-10-20 LAB — LIPASE, BLOOD: Lipase: 36 U/L (ref 11–51)

## 2020-10-20 MED ORDER — PANTOPRAZOLE SODIUM 40 MG IV SOLR
40.0000 mg | Freq: Once | INTRAVENOUS | Status: AC
  Administered 2020-10-20: 40 mg via INTRAVENOUS
  Filled 2020-10-20: qty 40

## 2020-10-20 MED ORDER — SODIUM CHLORIDE 0.9 % IV SOLN: Freq: Once | INTRAVENOUS | Status: AC

## 2020-10-20 MED ORDER — PANTOPRAZOLE SODIUM 20 MG PO TBEC: 20.0000 mg | DELAYED_RELEASE_TABLET | Freq: Every day | ORAL | 1 refills | Status: DC

## 2020-10-20 MED ORDER — IOHEXOL 350 MG/ML SOLN
100.0000 mL | Freq: Once | INTRAVENOUS | Status: AC | PRN
  Administered 2020-10-20: 100 mL via INTRAVENOUS

## 2020-10-20 NOTE — ED Triage Notes (Signed)
Reports was at a [facility] and started having epigastric pain that radiated around to the left and went home drank some soda water and had a bm and it subsided.  Wanted to make sure it was not gallbladder

## 2020-10-20 NOTE — ED Provider Notes (Addendum)
[facility] Provider Note   CSN: [id number] Arrival date & time: 10/20/20  1903     History Chief Complaint  Patient presents with   Abdominal Pain    Jade Williams is a 70 y.o. female.  HPI Patient reports that she had been in a picnic today and got suddenly severe epigastric pain.  She did have radiation back into her thoracic back.  She reports she got sweaty and nauseated.  She had a bowel movement and belch but symptoms were persisting.  Her daughter gave her some soda to drink.  She reports gradually the severity of pain started to ease.  At this time she reports feeling fatigued but is no longer having significant pain.  She does report periodically, especially at night she might feel pain in her thoracic back that radiates upwards and some aching in her jaws.  She reports this is happened a couple times during the year.  She reports she has had a stress test within the past 2 years without any ischemic findings and no history of stents.  Patient does have hypertension that is typically well controlled on medications.    Past Medical History:  Diagnosis Date   Hyperlipidemia    Hypertension    Pernicious anemia     There are no problems to display for this patient.   Past Surgical History:  Procedure Laterality Date   APPENDECTOMY     TONSILLECTOMY       OB History   No obstetric history on file.     No family history on file.  Social History   Tobacco Use   Smoking status: Never   Smokeless tobacco: Never  Vaping Use   Vaping Use: Never used  Substance Use Topics   Alcohol use: Yes    Comment: ocassional wine   Drug use: No    Home Medications Prior to Admission medications   Medication Sig Start Date End Date Taking? Authorizing Provider  pantoprazole (PROTONIX) 20 MG tablet Take 1 tablet (20 mg total) by mouth daily. 10/20/20  Yes Charlesetta Shanks, MD  atorvastatin (LIPITOR) 10 MG tablet Take 10 mg by mouth daily.     [provider]  calcium carbonate (CALCIUM 500) 1250 MG tablet Take 2 tablets by mouth daily.    [provider]  cholecalciferol (VITAMIN D) 1000 UNITS tablet Take 5,000 Units by mouth daily.     [provider]  Coenzyme Q10 (CO Q 10 PO) Take by mouth.    [provider]  COLLAGEN PO Take by mouth.    [provider]  cyanocobalamin (,VITAMIN B-12,) 1000 MCG/ML injection INJECT 1 ML INTRAMUSCULARLY EVERY 30 DAYS 02/03/18   [provider]  FLUoxetine (PROZAC) 20 MG tablet Take 20 mg by mouth daily.     [provider]  multivitamin-lutein (OCUVITE-LUTEIN) CAPS capsule Take 1 capsule by mouth daily.    [provider]  OVER THE COUNTER MEDICATION Take by mouth daily. tumeric    [provider]  OVER THE COUNTER MEDICATION Take by mouth daily. Emercency=1 packet per day    [provider]  vitamin E 400 UNIT capsule Take 400 Units by mouth daily.    [provider]    Allergies    Patient has no known allergies.  Review of Systems   Review of Systems 10 systems reviewed and negative except as per HPI Physical Exam Updated Vital Signs BP (!) 185/83   Pulse 67  Resp 13   Ht '5\' 3"'$  (1.6 m)   Wt 66.7 kg   SpO2 98%   BMI 26.04 kg/m   Physical Exam Constitutional:      Appearance: Normal appearance.  HENT:     Mouth/Throat:     Pharynx: Oropharynx is clear.  Eyes:     Extraocular Movements: Extraocular movements intact.  Cardiovascular:     Rate and Rhythm: Normal rate and regular rhythm.  Pulmonary:     Effort: Pulmonary effort is normal.     Breath sounds: Normal breath sounds.  Abdominal:     General: There is no distension.     Palpations: Abdomen is soft.     Tenderness: There is no abdominal tenderness. There is no guarding.  Musculoskeletal:        General: No swelling or tenderness. Normal range of motion.     Right lower leg: No edema.     Left lower leg: No edema.   Skin:    General: Skin is warm and dry.  Neurological:     General: No focal deficit present.     Mental Status: She is alert and oriented to person, place, and time.     Coordination: Coordination normal.  Psychiatric:        Mood and Affect: Mood normal.    ED Results / Procedures / Treatments   Labs (all labs ordered are listed, but only abnormal results are displayed) Labs Reviewed  COMPREHENSIVE METABOLIC PANEL - Abnormal; Notable for the following components:      Result Value   Glucose, Bld 108 (*)    Creatinine, Ser 1.09 (*)    GFR, Estimated 54 (*)    All other components within normal limits  CBC - Abnormal; Notable for the following components:   WBC 10.7 (*)    All other components within normal limits  LIPASE, BLOOD  URINALYSIS, ROUTINE W REFLEX MICROSCOPIC  TROPONIN I (HIGH SENSITIVITY)  TROPONIN I (HIGH SENSITIVITY)    EKG EKG Interpretation  Date/Time:  Sunday October 20 2020 19:42:02 EDT Ventricular Rate:  70 PR Interval:  169 QRS Duration: 85 QT Interval:  395 QTC Calculation: 427 R Axis:   76 Text Interpretation: Sinus rhythm normal no change from previous Confirmed by Charlesetta Shanks [phone]) on 10/20/2020 8:39:42 PM  Radiology CT Angio Chest/Abd/Pel for Dissection W and/or W/WO  Result Date: 10/20/2020 CLINICAL DATA:  Abdominal pain, aortic dissection suspected EXAM: CT ANGIOGRAPHY CHEST, ABDOMEN AND PELVIS TECHNIQUE: Non-contrast CT of the chest was initially obtained. Multidetector CT imaging through the chest, abdomen and pelvis was performed using the standard protocol during bolus administration of intravenous contrast. Multiplanar reconstructed images and MIPs were obtained and reviewed to evaluate the vascular anatomy. CONTRAST:  165m OMNIPAQUE IOHEXOL 350 MG/ML SOLN COMPARISON:  None. FINDINGS: CTA CHEST FINDINGS Cardiovascular: No evidence of high-density intramural hematoma on noncontrast exam. Mild aortic atherosclerosis. No aortic  aneurysm. After IV contrast administration there is no evidence of dissection, vasculitis or acute aortic findings. Conventional branching pattern from the aortic arch. There is no pulmonary embolus to the segmental level. The heart is normal in size. No pericardial effusion. Mediastinum/Nodes: No enlarged mediastinal or hilar lymph nodes. 8 mm hypodense left thyroid nodule. Not clinically significant; no follow-up imaging recommended (ref: J Am Coll Radiol. 2015 Feb;12(2): 143-50).Moderately large hiatal hernia with greater than 50% of the stomach intrathoracic. No associated gastric thickening or inflammation. Lungs/Pleura: Compressive atelectasis in the medial lower lobes related to hiatal hernia. The lungs  are otherwise clear. No consolidation. No pulmonary mass or nodule. No pleural fluid. The trachea and central bronchi are patent. Musculoskeletal: Thoracic spine degenerative change with disc space narrowing and endplate spurring. There are no acute or suspicious osseous abnormalities. No chest wall soft tissue abnormalities. Review of the MIP images confirms the above findings. CTA ABDOMEN AND PELVIS FINDINGS VASCULAR Aorta: Mild to moderate atherosclerosis. Normal in caliber without aneurysm, dissection, vasculitis or significant stenosis. Celiac: Patent without evidence of aneurysm, dissection, vasculitis or significant stenosis. SMA: Patent without evidence of aneurysm, dissection, vasculitis or significant stenosis. Renals: Both renal arteries are patent without evidence of aneurysm, dissection, vasculitis, fibromuscular dysplasia or significant stenosis. IMA: Patent without evidence of aneurysm, dissection, vasculitis or significant stenosis. Inflow: Patent without evidence of aneurysm, dissection, vasculitis or significant stenosis. Veins: No obvious venous abnormality within the limitations of this arterial phase study. Review of the MIP images confirms the above findings. NON-VASCULAR Hepatobiliary:  No focal hepatic abnormality on this arterial phase exam. Decompressed gallbladder. No calcified gallstone or pericholecystic inflammation. No biliary dilatation. Pancreas: No ductal dilatation or inflammation. Spleen: Normal in size and arterial enhancement. Adrenals/Urinary Tract: 10 mm right adrenal nodule. Normal left adrenal gland. No hydronephrosis, perinephric edema, or visualized renal calculi. Simple cyst arises from the upper right kidney. No solid renal lesion. Unremarkable urinary bladder. Stomach/Bowel: Moderate to large hiatal hernia with greater than 50% of the stomach being intrathoracic. No gastric outlet obstruction. Unremarkable small bowel. No small bowel inflammation. The appendix is not seen, appendectomy per history. Moderate colonic stool burden. No colonic wall thickening. Lymphatic: No abdominopelvic adenopathy. Reproductive: Small enhancing focus in the posterior uterus may represent a subcentimeter fibroid. Otherwise quiescent. No adnexal mass. Other: No free air, free fluid, or intra-abdominal fluid collection. Tiny fat containing umbilical hernia. Musculoskeletal: There is a Schmorl's node involving superior endplate of L2 as well as adjacent inferior endplate of L1. Vacuum phenomenon with degenerative disc disease in the lower lumbar spine. L4-L5 and L5-S1 facet hypertrophy. Focal bone lesion. Review of the MIP images confirms the above findings. IMPRESSION: 1. Thoracoabdominal aortic atherosclerosis without aneurysm or acute aortic findings. 2. Moderate to large hiatal hernia with greater than 50% of the stomach being intrathoracic. No gastric outlet obstruction or inflammation. 3. Indeterminate 10 mm right adrenal nodule. This is probably benign adenoma. Consider 12 month follow-up CT. Aortic Atherosclerosis (ICD10-I70.0). Electronically Signed   By: Keith Rake M.D.   On: 10/20/2020 21:47    Procedures Procedures   Medications Ordered in ED Medications  0.9 %  sodium  chloride infusion ( Intravenous New Bag/Given 10/20/20 2113)  pantoprazole (PROTONIX) injection 40 mg (40 mg Intravenous Given 10/20/20 2113)  iohexol (OMNIPAQUE) 350 MG/ML injection 100 mL (100 mLs Intravenous Contrast Given 10/20/20 2119)    ED Course  I have reviewed the triage vital signs and the nursing notes.  Pertinent labs & imaging results that were available during my care of the patient were reviewed by me and considered in my medical decision making (see chart for details).    MDM Rules/Calculators/A&P                         Patient had episode of severe epigastric pain rating to the back.  CT scan is negative for dissection.  Large hiatal hernia identified.  Patient symptoms had resolved prior to arrival.  At this time she has been started on Protonix.  We reviewed the diagnosis of a hiatal hernia  and follow-up plan with anticipated scheduled endoscopy.  Discharge instructions include dietary measures and return precautions.   Final Clinical Impression(s) / ED Diagnoses Final diagnoses:  Hiatal hernia    Rx / DC Orders ED Discharge Orders          Ordered    pantoprazole (PROTONIX) 20 MG tablet  Daily        10/20/20 2333             Charlesetta Shanks, MD 10/20/20 2111    Charlesetta Shanks, MD 10/20/20 2336

## 2020-10-20 NOTE — ED Notes (Signed)
Patient lying on stretcher with ABCs intact, alert and oriented x4, respirations even and unlabored, NSR on monitor. Patient stated around 1800 today, was eating at a picnic when developed sudden intense epigastric pain. Stated drank some soda water, belched, and had a BM. Patient states symptoms have resolved upon arriving to ED. Able to walk to restroom with steady gait.

## 2020-10-20 NOTE — ED Notes (Signed)
Patient left ED with ABCs intact, alert and oriented x4, respirations even and unlabored. Discharge instructions reviewed and all questions answered.   

## 2020-10-20 NOTE — Discharge Instructions (Addendum)
1.  Start taking Protonix daily as prescribed. 2.  Follow dietary and management instructions outlined in hiatal hernia. 3.  See your family doctor next week and discuss referral to gastroenterology for an upper endoscopy. 4.  Return to emergency department if you get severe pain and did not get relief with antacids, develop vomiting with blood or other concerning symptoms.

## 2020-10-22 DIAGNOSIS — K449 Diaphragmatic hernia without obstruction or gangrene: Secondary | ICD-10-CM | POA: Diagnosis not present

## 2020-10-22 DIAGNOSIS — K219 Gastro-esophageal reflux disease without esophagitis: Secondary | ICD-10-CM | POA: Diagnosis not present

## 2020-10-22 DIAGNOSIS — F411 Generalized anxiety disorder: Secondary | ICD-10-CM | POA: Diagnosis not present

## 2020-10-25 DIAGNOSIS — E782 Mixed hyperlipidemia: Secondary | ICD-10-CM | POA: Diagnosis not present

## 2020-10-25 DIAGNOSIS — I10 Essential (primary) hypertension: Secondary | ICD-10-CM | POA: Diagnosis not present

## 2020-10-28 DIAGNOSIS — F331 Major depressive disorder, recurrent, moderate: Secondary | ICD-10-CM | POA: Diagnosis not present

## 2020-10-28 DIAGNOSIS — K449 Diaphragmatic hernia without obstruction or gangrene: Secondary | ICD-10-CM | POA: Diagnosis not present

## 2020-10-28 DIAGNOSIS — F411 Generalized anxiety disorder: Secondary | ICD-10-CM | POA: Diagnosis not present

## 2020-10-28 DIAGNOSIS — E782 Mixed hyperlipidemia: Secondary | ICD-10-CM | POA: Diagnosis not present

## 2020-10-28 DIAGNOSIS — I10 Essential (primary) hypertension: Secondary | ICD-10-CM | POA: Diagnosis not present

## 2020-10-28 DIAGNOSIS — G4700 Insomnia, unspecified: Secondary | ICD-10-CM | POA: Diagnosis not present

## 2020-10-30 ENCOUNTER — Other Ambulatory Visit: Payer: Self-pay | Admitting: Family Medicine

## 2020-10-30 DIAGNOSIS — Z23 Encounter for immunization: Secondary | ICD-10-CM | POA: Diagnosis not present

## 2020-10-30 DIAGNOSIS — K449 Diaphragmatic hernia without obstruction or gangrene: Secondary | ICD-10-CM

## 2020-11-11 ENCOUNTER — Other Ambulatory Visit: Payer: Medicare Other

## 2020-11-11 ENCOUNTER — Ambulatory Visit
Admission: RE | Admit: 2020-11-11 | Discharge: 2020-11-11 | Disposition: A | Discharge status: Home / Self Care | Payer: Medicare Other | Source: Ambulatory Visit | Attending: Family Medicine | Admitting: Family Medicine

## 2020-11-11 DIAGNOSIS — K219 Gastro-esophageal reflux disease without esophagitis: Secondary | ICD-10-CM | POA: Diagnosis not present

## 2020-11-11 DIAGNOSIS — K449 Diaphragmatic hernia without obstruction or gangrene: Secondary | ICD-10-CM | POA: Diagnosis not present

## 2020-11-11 DIAGNOSIS — R109 Unspecified abdominal pain: Secondary | ICD-10-CM | POA: Diagnosis not present

## 2020-11-14 DIAGNOSIS — Z23 Encounter for immunization: Secondary | ICD-10-CM | POA: Diagnosis not present

## 2020-11-18 DIAGNOSIS — K449 Diaphragmatic hernia without obstruction or gangrene: Secondary | ICD-10-CM | POA: Diagnosis not present

## 2020-11-18 DIAGNOSIS — K222 Esophageal obstruction: Secondary | ICD-10-CM | POA: Diagnosis not present

## 2020-11-18 DIAGNOSIS — K219 Gastro-esophageal reflux disease without esophagitis: Secondary | ICD-10-CM | POA: Diagnosis not present

## 2020-11-21 DIAGNOSIS — K449 Diaphragmatic hernia without obstruction or gangrene: Secondary | ICD-10-CM | POA: Diagnosis not present

## 2020-11-21 DIAGNOSIS — F411 Generalized anxiety disorder: Secondary | ICD-10-CM | POA: Diagnosis not present

## 2020-11-21 DIAGNOSIS — K219 Gastro-esophageal reflux disease without esophagitis: Secondary | ICD-10-CM | POA: Diagnosis not present

## 2020-11-21 DIAGNOSIS — M791 Myalgia, unspecified site: Secondary | ICD-10-CM | POA: Diagnosis not present

## 2020-11-22 DIAGNOSIS — R5383 Other fatigue: Secondary | ICD-10-CM | POA: Diagnosis not present

## 2020-11-22 DIAGNOSIS — M791 Myalgia, unspecified site: Secondary | ICD-10-CM | POA: Diagnosis not present

## 2020-12-23 ENCOUNTER — Encounter: Payer: Self-pay | Admitting: Gastroenterology

## 2020-12-30 DIAGNOSIS — I10 Essential (primary) hypertension: Secondary | ICD-10-CM | POA: Diagnosis not present

## 2020-12-30 DIAGNOSIS — M545 Low back pain, unspecified: Secondary | ICD-10-CM | POA: Diagnosis not present

## 2020-12-30 DIAGNOSIS — E782 Mixed hyperlipidemia: Secondary | ICD-10-CM | POA: Diagnosis not present

## 2020-12-30 DIAGNOSIS — Z1339 Encounter for screening examination for other mental health and behavioral disorders: Secondary | ICD-10-CM | POA: Diagnosis not present

## 2020-12-30 DIAGNOSIS — Z1331 Encounter for screening for depression: Secondary | ICD-10-CM | POA: Diagnosis not present

## 2020-12-30 DIAGNOSIS — K449 Diaphragmatic hernia without obstruction or gangrene: Secondary | ICD-10-CM | POA: Diagnosis not present

## 2020-12-30 DIAGNOSIS — Z0000 Encounter for general adult medical examination without abnormal findings: Secondary | ICD-10-CM | POA: Diagnosis not present

## 2021-01-08 ENCOUNTER — Encounter: Payer: Self-pay | Admitting: Gastroenterology

## 2021-01-08 ENCOUNTER — Ambulatory Visit (INDEPENDENT_AMBULATORY_CARE_PROVIDER_SITE_OTHER): Payer: Medicare Other | Admitting: Gastroenterology

## 2021-01-08 VITALS — BP 128/86 | HR 72 | Ht 63.5 in | Wt 141.4 lb

## 2021-01-08 DIAGNOSIS — K219 Gastro-esophageal reflux disease without esophagitis: Secondary | ICD-10-CM | POA: Insufficient documentation

## 2021-01-08 DIAGNOSIS — K209 Esophagitis, unspecified without bleeding: Secondary | ICD-10-CM | POA: Insufficient documentation

## 2021-01-08 DIAGNOSIS — K449 Diaphragmatic hernia without obstruction or gangrene: Secondary | ICD-10-CM

## 2021-01-08 NOTE — Progress Notes (Signed)
01/08/2021 Jade Williams [id number] [birth year]   HISTORY OF PRESENT ILLNESS: This is a 70 year old female who is new to our office.  She is here today in regards to findings of large hiatal hernia and acid reflux.  She says that intermittently over the years she has noticed certain things that we will give her heartburn and reflux.  She learned to modify her diet and avoid certain things and does not usually have many issues.  Over Labor Day weekend, however, she had severe upper abdominal pain.  She went to the emergency department where a CTA was performed and showed the following:  Moderate to large hiatal hernia with greater than 50% of the stomach being intrathoracic. No gastric outlet obstruction or inflammation.  She then followed up with her PCP who ordered an esophagram that showed the following:  IMPRESSION: 1. Large hiatal hernia.  Marked gastroesophageal reflux elicited. 2. 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 No discrete esophageal mass. Upper endoscopic correlation suggested.  She was placed on omeprazole 40 mg twice daily.  Prior to that she had not been on any type of PPI therapy until these recent events.  She says that she would occasionally notice that things seem to get hung up a little bit, but no severe dysphagia.  She admits that she does have pain that radiates through her back and between her shoulder blades at times.  She says that she has lost 20 pounds recently with making more changes to her diet since all of this has occurred.  She tells me that she has had 2 colonoscopies with Dr. Earlean Shawl, the last about 4 years ago.  She has never had an EGD in the past.   Past Medical History:  Diagnosis Date   Depression    GERD (gastroesophageal reflux disease)    Hyperlipidemia    Hypertension    Pernicious anemia     Past Surgical History:  Procedure Laterality Date   APPENDECTOMY     TONSILLECTOMY      reports that she has never smoked. She has never used smokeless tobacco. She reports current alcohol use. She reports that she does not use drugs. family history is not on file. No Known Allergies    Outpatient Encounter Medications as of 01/08/2021  Medication Sig   atorvastatin (LIPITOR) 10 MG tablet Take 10 mg by mouth daily.   calcium carbonate (OS-CAL - DOSED IN MG OF ELEMENTAL CALCIUM) 1250 (500 Ca) MG tablet Take 2 tablets by mouth daily.   cholecalciferol (VITAMIN D) 1000 UNITS tablet Take 5,000 Units by mouth daily.    Coenzyme Q10 (CO Q 10 PO) Take by mouth.   cyanocobalamin (,VITAMIN B-12,) 1000 MCG/ML injection INJECT 1 ML INTRAMUSCULARLY EVERY 30 DAYS   OVER THE COUNTER MEDICATION Take by mouth daily. Emercency=1 packet per day   vitamins per pt   pantoprazole (PROTONIX) 40 MG tablet Take 40 mg by mouth 2 (two) times daily.   sertraline (ZOLOFT) 100 MG tablet Take 100 mg by mouth daily.   valsartan (DIOVAN) 40 MG tablet Take 40 mg by mouth daily. Takes 60 mg daily   vitamin E 400 UNIT capsule Take 400 Units by mouth daily.   COLLAGEN PO Take by mouth.   FLUoxetine (PROZAC) 20 MG tablet Take 20 mg by mouth daily.    multivitamin-lutein (OCUVITE-LUTEIN) CAPS capsule Take 1 capsule by mouth daily.  OVER THE COUNTER MEDICATION Take by mouth daily. tumeric   [DISCONTINUED] pantoprazole (PROTONIX) 20 MG tablet Take 1 tablet (20 mg total) by mouth daily.   No facility-administered encounter medications on file as of 01/08/2021.     REVIEW OF SYSTEMS  : All other systems reviewed and negative except where noted in the History of Present Illness.   PHYSICAL EXAM: BP 128/86   Pulse 72   Ht 5' 3.5" (1.613 m)   Wt 141 lb 6.4 oz (64.1 kg)   SpO2 96%   BMI 24.65 kg/m  General: Well developed white female in no acute distress Head: Normocephalic and atraumatic Eyes:  Sclerae  anicteric, conjunctiva pink. Ears: Normal auditory acuity Lungs: Clear throughout to auscultation; no W/R/R. Heart: Regular rate and rhythm; no M/R/G. Abdomen: Soft, non-distended.  BS present.  Non-tender. Musculoskeletal: Symmetrical with no gross deformities  Skin: No lesions on visible extremities Extremities: No edema  Neurological: Alert oriented x 4, grossly non-focal Psychological:  Alert and cooperative. Normal mood and affect  ASSESSMENT AND PLAN: *GERD, large hiatal hernia with intrathoracic stomach, esophagitis with possible stricture: She is on omeprazole 40 mg twice daily for the past couple of months.  We will plan for EGD with possible dilation with Dr. Rush Landmark.  She likely will need surgical evaluation and intervention.  The risks, benefits, and alternatives to EGD with dilation were discussed with the patient and she consents to proceed.    CC:  Fanny Bien, MD

## 2021-01-08 NOTE — Patient Instructions (Signed)
You have been scheduled for an endoscopy. Please follow written instructions given to you at your visit today. If you use inhalers (even only as needed), please bring them with you on the day of your procedure.  If you are age 72 or older, your body mass index should be between 23-30. Your Body mass index is 24.65 kg/m. If this is out of the aforementioned range listed, please consider follow up with your Primary Care Provider.  If you are age 72 or younger, your body mass index should be between 19-25. Your Body mass index is 24.65 kg/m. If this is out of the aformentioned range listed, please consider follow up with your Primary Care Provider.   ________________________________________________________  The  GI providers would like to encourage you to use [facility] to communicate with providers for non-urgent requests or questions.  Due to long hold times on the telephone, sending your provider a message by [facility] may be a faster and more efficient way to get a response.  Please allow 48 business hours for a response.  Please remember that this is for non-urgent requests.  _______________________________________________________

## 2021-01-09 NOTE — Progress Notes (Signed)
Attending Physician's Attestation   I have reviewed the chart.   I agree with the Advanced Practitioner's note, impression, and recommendations with any updates as below.    Clem Wisenbaker Mansouraty, MD [facility]oscopy Office # [phone]  

## 2021-01-14 ENCOUNTER — Ambulatory Visit (AMBULATORY_SURGERY_CENTER): Payer: Medicare Other | Admitting: Gastroenterology

## 2021-01-14 ENCOUNTER — Encounter: Payer: Self-pay | Admitting: Gastroenterology

## 2021-01-14 VITALS — BP 140/65 | HR 66 | Temp 97.3°F | Resp 22 | Ht 63.0 in | Wt 141.0 lb

## 2021-01-14 DIAGNOSIS — R1310 Dysphagia, unspecified: Secondary | ICD-10-CM | POA: Diagnosis not present

## 2021-01-14 DIAGNOSIS — R12 Heartburn: Secondary | ICD-10-CM

## 2021-01-14 DIAGNOSIS — R109 Unspecified abdominal pain: Secondary | ICD-10-CM | POA: Diagnosis not present

## 2021-01-14 DIAGNOSIS — K219 Gastro-esophageal reflux disease without esophagitis: Secondary | ICD-10-CM

## 2021-01-14 DIAGNOSIS — K2970 Gastritis, unspecified, without bleeding: Secondary | ICD-10-CM | POA: Diagnosis not present

## 2021-01-14 DIAGNOSIS — K31A0 Gastric intestinal metaplasia, unspecified: Secondary | ICD-10-CM | POA: Diagnosis not present

## 2021-01-14 DIAGNOSIS — K449 Diaphragmatic hernia without obstruction or gangrene: Secondary | ICD-10-CM | POA: Diagnosis not present

## 2021-01-14 DIAGNOSIS — K2270 Barrett's esophagus without dysplasia: Secondary | ICD-10-CM | POA: Diagnosis not present

## 2021-01-14 DIAGNOSIS — K2990 Gastroduodenitis, unspecified, without bleeding: Secondary | ICD-10-CM

## 2021-01-14 MED ORDER — SODIUM CHLORIDE 0.9 % IV SOLN: 500.0000 mL | Freq: Once | INTRAVENOUS | Status: DC

## 2021-01-14 NOTE — Progress Notes (Signed)
CHECK-IN-AM  V/S-DT 

## 2021-01-14 NOTE — Op Note (Signed)
[facility] Patient Name: Jade Williams Procedure Date: 01/14/2021 9:06 AM MRN: [id number] Endoscopist: Justice Britain , MD Age: 70 Referring MD:  Date of Birth: [birth year] Gender: Female Account #: [id number] Procedure:                Upper GI endoscopy Indications:              Abdominal pain, Dysphagia, Heartburn, Suspected                            gastro-esophageal reflux disease, Suspected                            stenosis of the esophagus, Abnormal UGI series,                            Endoscopy to confirm esophageal stricture that was                            demonstrated on previous imaging study (suggestion                            of stricture on Barium swallow), Chest Pain, Back                            Pain, Shortness of breath while bending over Medicines:                Monitored Anesthesia Care Procedure:                Pre-Anesthesia Assessment:                           - Prior to the procedure, a History and Physical                            was performed, and patient medications and                            allergies were reviewed. The patient's tolerance of                            previous anesthesia was also reviewed. The risks                            and benefits of the procedure and the sedation                            options and risks were discussed with the patient.                            All questions were answered, and informed consent                            was obtained. Prior Anticoagulants: The patient has  taken no previous anticoagulant or antiplatelet                            agents. ASA Grade Assessment: II - A patient with                            mild systemic disease. After reviewing the risks                            and benefits, the patient was deemed in                            satisfactory condition to undergo the procedure.                           After  obtaining informed consent, the endoscope was                            passed under direct vision. Throughout the                            procedure, the patient's blood pressure, pulse, and                            oxygen saturations were monitored continuously. The                            Endoscope was introduced through the mouth, and                            advanced to the second part of duodenum. The upper                            GI endoscopy was accomplished without difficulty.                            The patient tolerated the procedure. Scope In: Scope Out: Findings:                 No gross mucosal lesions were noted in the entire                            esophagus. Biopsies were taken with a cold forceps                            for histology to rule out EoE/LoE. After the rest                            of the EGD was completed, a guidewire was placed                            and the scope was withdrawn. Dilation was performed  with a Savary dilator with mild resistance at 18                            mm. The dilation site was examined following                            endoscope reinsertion and showed no change.                           The examined esophagus was moderately tortuous.                           The Z-line was regular and was found 31 cm from the                            incisors.                           A 9 cm hiatal hernia was found. The proximal extent                            of the gastric folds (end of tubular esophagus) was                            32 cm from the incisors. The hiatal narrowing was                            40 cm from the incisors. The Z-line was 31 cm from                            the incisors.                           Segmental moderate inflammation characterized by                            erosions, erythema and granularity was found in the                             entire examined stomach. Biopsies were taken with a                            cold forceps for histology and Helicobacter pylori                            testing.                           No gross lesions were noted in the duodenal bulb,                            in the first portion of the duodenum and in the  second portion of the duodenum. Complications:            No immediate complications. Estimated Blood Loss:     Estimated blood loss was minimal. Impression:               - No gross mucosal lesions in esophagus. Tortuosity                            noted throughout. Esophagus biopsied. Dilated.                           - Z-line regular, 31 cm from the incisors.                           - 9 cm hiatal hernia.                           - Gastritis. Biopsied.                           - No gross lesions in the duodenal bulb, in the                            first portion of the duodenum and in the second                            portion of the duodenum. Recommendation:           - The patient will be observed post-procedure,                            until all discharge criteria are met.                           - Discharge patient to home.                           - Patient has a contact number available for                            emergencies. The signs and symptoms of potential                            delayed complications were discussed with the                            patient. Return to normal activities tomorrow.                            Written discharge instructions were provided to the                            patient.                           - Dilation diet as per protocol.                           -  Continue present medications.                           - Await pathology results.                           - Refer to a Thoracic/General surgeon to discuss                            potential role of hernia repair is  not                            unreasonable. If manometry is needed for our                            surgical colleagues, we can work on arranging this.                           - The findings and recommendations were discussed                            with the patient.                           - The findings and recommendations were discussed                            with the designated responsible adult. Justice Britain, MD 01/14/2021 10:03:02 AM

## 2021-01-14 NOTE — Progress Notes (Signed)
Sedate, gd SR, tolerated procedure well, VSS, report to RN 

## 2021-01-14 NOTE — Progress Notes (Signed)
Called to room to assist during endoscopic procedure.  Patient ID and intended procedure confirmed with present staff. Received instructions for my participation in the procedure from the performing physician.  

## 2021-01-14 NOTE — Patient Instructions (Addendum)
Follow Dilation diet. Continue present medications. Awaiting pathology results. Refer to Thoracic/General surgeon to discuss potential role of hernia repair.  YOU HAD AN ENDOSCOPIC PROCEDURE TODAY AT [facility]:   Refer to the procedure report that was given to you for any specific questions about what was found during the examination.  If the procedure report does not answer your questions, please call your gastroenterologist to clarify.  If you requested that your care partner not be given the details of your procedure findings, then the procedure report has been included in a sealed envelope for you to review at your convenience later.  YOU SHOULD EXPECT: Some feelings of bloating in the abdomen. Passage of more gas than usual.  Walking can help get rid of the air that was put into your GI tract during the procedure and reduce the bloating. If you had a lower endoscopy (such as a colonoscopy or flexible sigmoidoscopy) you may notice spotting of blood in your stool or on the toilet paper. If you underwent a bowel prep for your procedure, you may not have a normal bowel movement for a few days.  Please Note:  You might notice some irritation and congestion in your nose or some drainage.  This is from the oxygen used during your procedure.  There is no need for concern and it should clear up in a day or so.  SYMPTOMS TO REPORT IMMEDIATELY:  Following upper endoscopy (EGD)  Vomiting of blood or coffee ground material  New chest pain or pain under the shoulder blades  Painful or persistently difficult swallowing  New shortness of breath  Fever of 100F or higher  Black, tarry-looking stools  For urgent or emergent issues, a gastroenterologist can be reached at any hour by calling [phone]. Do not use MyChart messaging for urgent concerns.    DIET:  We do recommend a small meal at first, but then you may proceed to your regular diet.  Drink plenty of fluids but you should  avoid alcoholic beverages for 24 hours.  ACTIVITY:  You should plan to take it easy for the rest of today and you should NOT DRIVE or use heavy machinery until tomorrow (because of the sedation medicines used during the test).    FOLLOW UP: Our staff will call the number listed on your records 48-72 hours following your procedure to check on you and address any questions or concerns that you may have regarding the information given to you following your procedure. If we do not reach you, we will leave a message.  We will attempt to reach you two times.  During this call, we will ask if you have developed any symptoms of COVID 19. If you develop any symptoms (ie: fever, flu-like symptoms, shortness of breath, cough etc.) before then, please call [phone].  If you test positive for Covid 19 in the 2 weeks post procedure, please call and report this information to [place].    If any biopsies were taken you will be contacted by phone or by letter within the next 1-3 weeks.  Please call us at [phone] if you have not heard about the biopsies in 3 weeks.    SIGNATURES/CONFIDENTIALITY: You and/or your care partner have signed paperwork which will be entered into your electronic medical record.  These signatures attest to the fact that that the information above on your After Visit Summary has been reviewed and is understood.  Full responsibility of the confidentiality of this discharge information  lies with you and/or your care-partner.  

## 2021-01-14 NOTE — Progress Notes (Signed)
GASTROENTEROLOGY PROCEDURE H&P NOTE   Primary Care Physician: Fanny Bien, MD  HPI: Jade Williams is a 70 y.o. female who presents for EGD for evaluation of large hiatal hernia, GERD symptoms, query stricture and potential dilation.  Past Medical History:  Diagnosis Date   Depression    GERD (gastroesophageal reflux disease)    Hyperlipidemia    Hypertension    Pernicious anemia    Past Surgical History:  Procedure Laterality Date   APPENDECTOMY     TONSILLECTOMY     Current Outpatient Medications  Medication Sig Dispense Refill   atorvastatin (LIPITOR) 10 MG tablet Take 10 mg by mouth daily.     calcium carbonate (OS-CAL - DOSED IN MG OF ELEMENTAL CALCIUM) 1250 (500 Ca) MG tablet Take 2 tablets by mouth daily.     cholecalciferol (VITAMIN D) 1000 UNITS tablet Take 5,000 Units by mouth daily.      Coenzyme Q10 (CO Q 10 PO) Take by mouth.     COLLAGEN PO Take by mouth.     cyanocobalamin (,VITAMIN B-12,) 1000 MCG/ML injection INJECT 1 ML INTRAMUSCULARLY EVERY 30 DAYS     FLUoxetine (PROZAC) 20 MG tablet Take 20 mg by mouth daily.      multivitamin-lutein (OCUVITE-LUTEIN) CAPS capsule Take 1 capsule by mouth daily.     OVER THE COUNTER MEDICATION Take by mouth daily. tumeric     OVER THE COUNTER MEDICATION Take by mouth daily. Emercency=1 packet per day   vitamins per pt     pantoprazole (PROTONIX) 40 MG tablet Take 40 mg by mouth 2 (two) times daily.     sertraline (ZOLOFT) 100 MG tablet Take 100 mg by mouth daily.     valsartan (DIOVAN) 40 MG tablet Take 40 mg by mouth daily. Takes 60 mg daily     vitamin E 400 UNIT capsule Take 400 Units by mouth daily.     Current Facility-Administered Medications  Medication Dose Route Frequency Provider Last Rate Last Admin   0.9 %  sodium chloride infusion  500 mL Intravenous Once Mansouraty, Telford Nab., MD        Current Outpatient Medications:    atorvastatin (LIPITOR) 10 MG tablet, Take 10 mg by mouth daily., Disp:  , Rfl:    calcium carbonate (OS-CAL - DOSED IN MG OF ELEMENTAL CALCIUM) 1250 (500 Ca) MG tablet, Take 2 tablets by mouth daily., Disp: , Rfl:    cholecalciferol (VITAMIN D) 1000 UNITS tablet, Take 5,000 Units by mouth daily. , Disp: , Rfl:    Coenzyme Q10 (CO Q 10 PO), Take by mouth., Disp: , Rfl:    COLLAGEN PO, Take by mouth., Disp: , Rfl:    cyanocobalamin (,VITAMIN B-12,) 1000 MCG/ML injection, INJECT 1 ML INTRAMUSCULARLY EVERY 30 DAYS, Disp: , Rfl:    FLUoxetine (PROZAC) 20 MG tablet, Take 20 mg by mouth daily. , Disp: , Rfl:    multivitamin-lutein (OCUVITE-LUTEIN) CAPS capsule, Take 1 capsule by mouth daily., Disp: , Rfl:    OVER THE COUNTER MEDICATION, Take by mouth daily. tumeric, Disp: , Rfl:    OVER THE COUNTER MEDICATION, Take by mouth daily. Emercency=1 packet per day   vitamins per pt, Disp: , Rfl:    pantoprazole (PROTONIX) 40 MG tablet, Take 40 mg by mouth 2 (two) times daily., Disp: , Rfl:    sertraline (ZOLOFT) 100 MG tablet, Take 100 mg by mouth daily., Disp: , Rfl:    valsartan (DIOVAN) 40 MG tablet, Take 40 mg by mouth  daily. Takes 60 mg daily, Disp: , Rfl:    vitamin E 400 UNIT capsule, Take 400 Units by mouth daily., Disp: , Rfl:   Current Facility-Administered Medications:    0.9 %  sodium chloride infusion, 500 mL, Intravenous, Once, Mansouraty, Telford Nab., MD No Known Allergies History reviewed. No pertinent family history. Social History   Socioeconomic History   Marital status: Single    Spouse name: Not on file   Number of children: Not on file   Years of education: Not on file   Highest education level: Not on file  Occupational History   Not on file  Tobacco Use   Smoking status: Never   Smokeless tobacco: Never  Vaping Use   Vaping Use: Never used  Substance and Sexual Activity   Alcohol use: Yes    Comment: ocassional wine   Drug use: No   Sexual activity: Not on file  Other Topics Concern   Not on file  Social History Narrative   Not on file    Social Determinants of Health   Financial Resource Strain: Not on file  Food Insecurity: Not on file  Transportation Needs: Not on file  Physical Activity: Not on file  Stress: Not on file  Social Connections: Not on file  Intimate Partner Violence: Not on file    Physical Exam: Today's Vitals   01/14/21 0858  BP: 140/74  Pulse: 71  Temp: (!) 97.3 F (36.3 C)  SpO2: 98%  Weight: 141 lb (64 kg)  Height: 5\' 3"  (1.6 m)   Body mass index is 24.98 kg/m. GEN: NAD EYE: Sclerae anicteric ENT: MMM CV: Non-tachycardic GI: Soft, NT/ND NEURO:  Alert & Oriented x 3  Lab Results: No results for input(s): WBC, HGB, HCT, PLT in the last 72 hours. BMET No results for input(s): NA, K, CL, CO2, GLUCOSE, BUN, CREATININE, CALCIUM in the last 72 hours. LFT No results for input(s): PROT, ALBUMIN, AST, ALT, ALKPHOS, BILITOT, BILIDIR, IBILI in the last 72 hours. PT/INR No results for input(s): LABPROT, INR in the last 72 hours.   Impression / Plan: This is a 70 y.o.female who presents for EGD for evaluation of large hiatal hernia, GERD symptoms, query stricture and potential dilation.  The risks and benefits of endoscopic evaluation/treatment were discussed with the patient and/or family; these include but are not limited to the risk of perforation, infection, bleeding, missed lesions, lack of diagnosis, severe illness requiring hospitalization, as well as anesthesia and sedation related illnesses.  The patient's history has been reviewed, patient examined, no change in status, and deemed stable for procedure.  The patient and/or family is agreeable to proceed.    Justice Britain, MD [facility] Office # [phone]

## 2021-01-16 ENCOUNTER — Telehealth: Payer: Self-pay | Admitting: *Deleted

## 2021-01-16 NOTE — Telephone Encounter (Signed)
  Follow up Call-  Call back number 01/14/2021  Post procedure Call Back phone  # [phone]  Permission to leave phone message Yes  Some recent data might be hidden     Patient questions:  Do you have a fever, pain , or abdominal swelling? No. Pain Score  0 *  Have you tolerated food without any problems? Yes.    Have you been able to return to your normal activities? Yes.    Do you have any questions about your discharge instructions: Diet   No. Medications  No. Follow up visit  No.  Do you have questions or concerns about your Care? No.  Actions: * If pain score is 4 or above: No action needed, pain <4.  Have you developed a fever since your procedure? no  2.   Have you had an respiratory symptoms (SOB or cough) since your procedure? no  3.   Have you tested positive for COVID 19 since your procedure no  4.   Have you had any family members/close contacts diagnosed with the COVID 19 since your procedure?  no   If yes to any of these questions please route to Joylene John, RN and Joella Prince, RN

## 2021-01-17 ENCOUNTER — Encounter: Payer: Self-pay | Admitting: Gastroenterology

## 2021-01-17 DIAGNOSIS — K2270 Barrett's esophagus without dysplasia: Secondary | ICD-10-CM

## 2021-01-17 HISTORY — DX: Barrett's esophagus without dysplasia: K22.70

## 2021-01-22 DIAGNOSIS — E785 Hyperlipidemia, unspecified: Secondary | ICD-10-CM | POA: Diagnosis not present

## 2021-01-22 DIAGNOSIS — K224 Dyskinesia of esophagus: Secondary | ICD-10-CM | POA: Diagnosis not present

## 2021-01-22 DIAGNOSIS — I10 Essential (primary) hypertension: Secondary | ICD-10-CM | POA: Diagnosis not present

## 2021-01-22 DIAGNOSIS — K449 Diaphragmatic hernia without obstruction or gangrene: Secondary | ICD-10-CM | POA: Diagnosis not present

## 2021-01-22 DIAGNOSIS — K219 Gastro-esophageal reflux disease without esophagitis: Secondary | ICD-10-CM | POA: Diagnosis not present

## 2021-01-24 ENCOUNTER — Telehealth: Payer: Self-pay

## 2021-01-24 NOTE — Telephone Encounter (Signed)
NOTES SCANNED TO REFERRAL 

## 2021-01-27 ENCOUNTER — Telehealth: Payer: Self-pay

## 2021-01-27 ENCOUNTER — Other Ambulatory Visit: Payer: Self-pay

## 2021-01-27 DIAGNOSIS — K2990 Gastroduodenitis, unspecified, without bleeding: Secondary | ICD-10-CM

## 2021-01-27 DIAGNOSIS — K2100 Gastro-esophageal reflux disease with esophagitis, without bleeding: Secondary | ICD-10-CM

## 2021-01-27 DIAGNOSIS — K2970 Gastritis, unspecified, without bleeding: Secondary | ICD-10-CM

## 2021-01-27 DIAGNOSIS — K219 Gastro-esophageal reflux disease without esophagitis: Secondary | ICD-10-CM

## 2021-01-27 DIAGNOSIS — K209 Esophagitis, unspecified without bleeding: Secondary | ICD-10-CM

## 2021-01-27 NOTE — Telephone Encounter (Signed)
Received fax consult note from Dr. Greer Pickerel- [facility]. Per his recommendations pt needs to be set-up for Manometry for ongoing difficulty swallowing. Spoke with patient and she initially wanted to wait to see if the ENT would agree with plan, but I then informed her how far out we are scheduling patients for Manometry's, so she agreed to scheduled for now, and if ENT thinks that a manometry is not needed we could always cancel it. Patient has been scheduled for 04/23/21 at 12:30pm at [facility]. Instructions will be sent to patient via mychart.

## 2021-01-27 NOTE — Progress Notes (Signed)
Patient has been scheduled for 04/23/21 at 12:30pm @ [facility] for Manometry.

## 2021-02-04 DIAGNOSIS — E663 Overweight: Secondary | ICD-10-CM | POA: Diagnosis not present

## 2021-02-04 DIAGNOSIS — D510 Vitamin B12 deficiency anemia due to intrinsic factor deficiency: Secondary | ICD-10-CM | POA: Diagnosis not present

## 2021-02-04 DIAGNOSIS — I10 Essential (primary) hypertension: Secondary | ICD-10-CM | POA: Diagnosis not present

## 2021-02-04 DIAGNOSIS — R7989 Other specified abnormal findings of blood chemistry: Secondary | ICD-10-CM | POA: Diagnosis not present

## 2021-02-04 DIAGNOSIS — E782 Mixed hyperlipidemia: Secondary | ICD-10-CM | POA: Diagnosis not present

## 2021-02-05 LAB — LIPID PANEL
Cholesterol: 212 — AB (ref 0–200)
HDL: 64 (ref 35–70)
LDL Cholesterol: 126
Triglycerides: 112 (ref 40–160)

## 2021-02-05 LAB — COMPREHENSIVE METABOLIC PANEL
Albumin: 4.4 (ref 3.5–5.0)
Calcium: 9.1 (ref 8.7–10.7)
eGFR: 84

## 2021-02-05 LAB — BASIC METABOLIC PANEL
BUN: 19 (ref 4–21)
CO2: 28 — AB (ref 13–22)
Chloride: 104 (ref 99–108)
Creatinine: 0.7 (ref 0.5–1.1)
Glucose: 95
Potassium: 3.9 (ref 3.4–5.3)
Sodium: 142 (ref 137–147)

## 2021-02-05 LAB — CBC AND DIFFERENTIAL
HCT: 39 (ref 36–46)
Hemoglobin: 13.3 (ref 12.0–16.0)
Platelets: 223 (ref 150–399)
WBC: 6.4

## 2021-02-05 LAB — HEPATIC FUNCTION PANEL
ALT: 23 (ref 7–35)
AST: 26 (ref 13–35)
Alkaline Phosphatase: 138 — AB (ref 25–125)
Bilirubin, Total: 0.5

## 2021-02-05 LAB — TSH: TSH: 2.16 (ref 0.41–5.90)

## 2021-02-05 LAB — CBC: RBC: 4.52 (ref 3.87–5.11)

## 2021-02-06 DIAGNOSIS — E782 Mixed hyperlipidemia: Secondary | ICD-10-CM | POA: Diagnosis not present

## 2021-02-06 DIAGNOSIS — K2270 Barrett's esophagus without dysplasia: Secondary | ICD-10-CM | POA: Diagnosis not present

## 2021-02-06 DIAGNOSIS — I10 Essential (primary) hypertension: Secondary | ICD-10-CM | POA: Diagnosis not present

## 2021-02-06 DIAGNOSIS — D519 Vitamin B12 deficiency anemia, unspecified: Secondary | ICD-10-CM | POA: Diagnosis not present

## 2021-02-06 DIAGNOSIS — K219 Gastro-esophageal reflux disease without esophagitis: Secondary | ICD-10-CM | POA: Diagnosis not present

## 2021-03-31 ENCOUNTER — Ambulatory Visit: Payer: Medicare Other | Admitting: Interventional Cardiology

## 2021-04-01 ENCOUNTER — Telehealth: Payer: Self-pay | Admitting: Gastroenterology

## 2021-04-01 NOTE — Telephone Encounter (Signed)
Rtn call to patient. She had questions about upcoming Manometry. Patient states that she may want to r/s manometry, but would like to leave as scheduled for now. I explained to pt that we could also schedule her a follow-up appointment to see Dr. Rush Landmark prior to Manometry if she has concerns or questions. Pt states that she will give me a call back in a few weeks and let me know what she decides.

## 2021-04-01 NOTE — Progress Notes (Signed)
New Patient Office Visit  Subjective:  Patient ID: Jade Williams, female    DOB: 1950/05/02  Age: 71 y.o. MRN: [id number]  CC:  Chief Complaint  Patient presents with   Establish Care    Np. Est care. No main concerns.     HPI Jade Williams presents for new patient visit to establish care.  Introduced to Designer, jewellery role and practice setting.  All questions answered.  Discussed provider/patient relationship and expectations.  Jade Williams has a history of GERD and a large hiatal hernia. She has been following with GI and had a recent endoscopy. She has completely changed her diet over the past few months, eating smaller meals, drinking manuka honey tea, and drinking alkaline water. She has lost 20 pounds and her symptoms have improved. Her goal is not to have surgery, however she is concerned for silent reflux.   She also has a history of chronic sinus drainage and had several sinus infections this past weekend. She takes loratadine as needed for this. She would like a referral to ENT to evaluate this further.   Jade Williams has a history of hypertension for which she takes valsartan. She denies any side effects from the medication, chest pain, shortness of breath, and dizziness. She states her blood pressure has been well controlled.   She also has a history of anxiety. She takes sertraline daily which helps control her symptoms. She states her anxiety is well controlled.  Depression screen PHQ 2/9 04/02/2021  Decreased Interest 0  Down, Depressed, Hopeless 0  PHQ - 2 Score 0    Past Medical History:  Diagnosis Date   Depression    GERD (gastroesophageal reflux disease)    Hyperlipidemia    Hypertension    Pernicious anemia     Past Surgical History:  Procedure Laterality Date   APPENDECTOMY     TONSILLECTOMY     WISDOM TOOTH EXTRACTION      Family History  Problem Relation Age of Onset   Hyperlipidemia Mother    Hypertension Mother    Stroke Mother     Heart disease Mother    Macular degeneration Mother    Cancer Father        lung cancer   Cancer Sister        lung cancer   Colon cancer Neg Hx    Colon polyps Neg Hx    Esophageal cancer Neg Hx    Rectal cancer Neg Hx    Stomach cancer Neg Hx     Social History   Socioeconomic History   Marital status: Single    Spouse name: Not on file   Number of children: Not on file   Years of education: Not on file   Highest education level: Not on file  Occupational History   Not on file  Tobacco Use   Smoking status: Never   Smokeless tobacco: Never  Vaping Use   Vaping Use: Never used  Substance and Sexual Activity   Alcohol use: Yes    Comment: ocassional wine   Drug use: No   Sexual activity: Not on file  Other Topics Concern   Not on file  Social History Narrative   Not on file   Social Determinants of Health   Financial Resource Strain: Not on file  Food Insecurity: Not on file  Transportation Needs: Not on file  Physical Activity: Not on file  Stress: Not on file  Social Connections: Not on file  Intimate Partner Violence: Not on  file    ROS Review of Systems  Constitutional: Negative.   HENT:  Positive for congestion, postnasal drip and tinnitus.   Eyes: Negative.   Respiratory: Negative.    Cardiovascular: Negative.   Gastrointestinal: Negative.   Genitourinary: Negative.   Musculoskeletal:  Positive for back pain (uses roller to help with pain).  Skin: Negative.   Neurological: Negative.   Psychiatric/Behavioral: Negative.     Objective:   Today's Vitals: BP 131/65    Pulse 69    Temp (!) 96.9 F (36.1 C) (Temporal)    Wt 139 lb 9.6 oz (63.3 kg)    SpO2 97%    BMI 24.73 kg/m   Physical Exam Vitals and nursing note reviewed.  Constitutional:      General: She is not in acute distress.    Appearance: Normal appearance.  HENT:     Head: Normocephalic and atraumatic.     Right Ear: Tympanic membrane, ear canal and external ear normal.     Left  Ear: Tympanic membrane, ear canal and external ear normal.     Nose: Nose normal.     Mouth/Throat:     Mouth: Mucous membranes are moist.     Pharynx: Oropharynx is clear.  Eyes:     Conjunctiva/sclera: Conjunctivae normal.  Cardiovascular:     Rate and Rhythm: Normal rate and regular rhythm.     Pulses: Normal pulses.     Heart sounds: Normal heart sounds.  Pulmonary:     Effort: Pulmonary effort is normal.     Breath sounds: Normal breath sounds.  Abdominal:     General: Bowel sounds are normal.     Palpations: Abdomen is soft.     Tenderness: There is no abdominal tenderness.  Musculoskeletal:        General: Normal range of motion.     Cervical back: Normal range of motion. No tenderness.  Lymphadenopathy:     Cervical: No cervical adenopathy.  Skin:    General: Skin is warm and dry.  Neurological:     General: No focal deficit present.     Mental Status: She is alert and oriented to person, place, and time.     Cranial Nerves: No cranial nerve deficit.     Coordination: Coordination normal.     Gait: Gait normal.  Psychiatric:        Mood and Affect: Mood normal.        Behavior: Behavior normal.        Thought Content: Thought content normal.        Judgment: Judgment normal.    Assessment & Plan:   Problem List Items Addressed This Visit       Cardiovascular and Mediastinum   Primary hypertension - Primary    Chronic, stable. BP 131/65 at today's visit. Continue valsartan 40mg  daily. Last CMP reviewed from September. Follow up in 6 months.         Digestive   Gastroesophageal reflux disease    Chronic, stable. Symptoms have improved significantly with diet changes. She only takes the prilosec as needed. Continue dietary changes and continuing collaboration and recommendations from GI.         Other   Large hiatal hernia    Ongoing. Continue collaboration and recommendations from GI.       Seasonal allergies    Chronic, ongoing. She can use OTC  flonase, cetirizine, or loratadine to help symptoms. Will place referral to ENT per patient request.  Relevant Orders   Ambulatory referral to ENT   Anxiety    Chronic, stable. Continue sertraline 100mg  daily. PHQ-2 score 0 today, will complete full PHQ-9 and GAD 7 next visit. Follow up in 6 months.       Hyperlipidemia    Chronic, requesting labs from patient's prior PCP, she stated they were just checked recently.       Pernicious anemia    Taking monthly vitamin B12 injections at home. Will request labs and notes from prior PCP.       Other Visit Diagnoses     History of sinusitis       Will place referral to ENT per patient request.   Relevant Orders   Ambulatory referral to ENT   Colon cancer screening       Cologuard ordered today   Relevant Orders   Cologuard       Outpatient Encounter Medications as of 04/02/2021  Medication Sig   Bioflavonoid Products (VITAMIN C) CHEW Chew by mouth.   MAGNESIUM CARBONATE PO Take by mouth.   atorvastatin (LIPITOR) 10 MG tablet Take 10 mg by mouth daily.   calcium carbonate (OS-CAL - DOSED IN MG OF ELEMENTAL CALCIUM) 1250 (500 Ca) MG tablet Take 2 tablets by mouth daily.   cholecalciferol (VITAMIN D) 1000 UNITS tablet Take 5,000 Units by mouth daily.    Coenzyme Q10 (CO Q 10 PO) Take by mouth.   cyanocobalamin (,VITAMIN B-12,) 1000 MCG/ML injection INJECT 1 ML INTRAMUSCULARLY EVERY 30 DAYS   omeprazole (PRILOSEC) 40 MG capsule SMARTSIG:1 Capsule(s) By Mouth 1-2 Times Daily PRN (Patient not taking: Reported on 04/02/2021)   sertraline (ZOLOFT) 100 MG tablet Take 100 mg by mouth daily.   valsartan (DIOVAN) 40 MG tablet Take 40 mg by mouth daily. Takes 60 mg daily   vitamin E 400 UNIT capsule Take 400 Units by mouth daily.   [DISCONTINUED] OVER THE COUNTER MEDICATION Take by mouth daily. Emercency=1 packet per day   vitamins per pt   No facility-administered encounter medications on file as of 04/02/2021.    Follow-up: Return  in about 6 months (around 09/30/2021) for HTN, HLD, Anxiety.   Charyl Dancer, NP

## 2021-04-01 NOTE — Telephone Encounter (Signed)
Inbound call from patient requesting a call from a nurse please in regards to manometry upcoming procedure.

## 2021-04-02 ENCOUNTER — Ambulatory Visit (INDEPENDENT_AMBULATORY_CARE_PROVIDER_SITE_OTHER): Payer: Medicare Other | Admitting: Nurse Practitioner

## 2021-04-02 ENCOUNTER — Encounter: Payer: Self-pay | Admitting: Nurse Practitioner

## 2021-04-02 ENCOUNTER — Other Ambulatory Visit: Payer: Self-pay

## 2021-04-02 VITALS — BP 131/65 | HR 69 | Temp 96.9°F | Wt 139.6 lb

## 2021-04-02 DIAGNOSIS — D510 Vitamin B12 deficiency anemia due to intrinsic factor deficiency: Secondary | ICD-10-CM

## 2021-04-02 DIAGNOSIS — J302 Other seasonal allergic rhinitis: Secondary | ICD-10-CM

## 2021-04-02 DIAGNOSIS — E785 Hyperlipidemia, unspecified: Secondary | ICD-10-CM

## 2021-04-02 DIAGNOSIS — K449 Diaphragmatic hernia without obstruction or gangrene: Secondary | ICD-10-CM

## 2021-04-02 DIAGNOSIS — K219 Gastro-esophageal reflux disease without esophagitis: Secondary | ICD-10-CM | POA: Diagnosis not present

## 2021-04-02 DIAGNOSIS — I10 Essential (primary) hypertension: Secondary | ICD-10-CM

## 2021-04-02 DIAGNOSIS — Z1211 Encounter for screening for malignant neoplasm of colon: Secondary | ICD-10-CM

## 2021-04-02 DIAGNOSIS — Z8709 Personal history of other diseases of the respiratory system: Secondary | ICD-10-CM

## 2021-04-02 DIAGNOSIS — F419 Anxiety disorder, unspecified: Secondary | ICD-10-CM

## 2021-04-02 NOTE — Patient Instructions (Addendum)
It was great to see you!  I am requesting records from Dr. Ernie Hew and Dr. Redmond Pulling with [facility]. I will review them and also send a copy to you.   Let's follow-up in 6 months, sooner if you have concerns.  If a referral was placed today, you will be contacted for an appointment. Please note that routine referrals can sometimes take up to 3-4 weeks to process. Please call our office if you haven't heard anything after this time frame.  Take care,  Vance Peper, NP

## 2021-04-03 DIAGNOSIS — D510 Vitamin B12 deficiency anemia due to intrinsic factor deficiency: Secondary | ICD-10-CM | POA: Insufficient documentation

## 2021-04-03 NOTE — Assessment & Plan Note (Addendum)
Chronic, stable. Continue sertraline 100mg  daily. PHQ-2 score 0 today, will complete full PHQ-9 and GAD 7 next visit. Follow up in 6 months.

## 2021-04-03 NOTE — Assessment & Plan Note (Signed)
Chronic, stable. Symptoms have improved significantly with diet changes. She only takes the prilosec as needed. Continue dietary changes and continuing collaboration and recommendations from GI.

## 2021-04-03 NOTE — Assessment & Plan Note (Signed)
Chronic, ongoing. She can use OTC flonase, cetirizine, or loratadine to help symptoms. Will place referral to ENT per patient request.

## 2021-04-03 NOTE — Assessment & Plan Note (Signed)
Ongoing. Continue collaboration and recommendations from GI.

## 2021-04-03 NOTE — Assessment & Plan Note (Signed)
Chronic, requesting labs from patient's prior PCP, she stated they were just checked recently.

## 2021-04-03 NOTE — Assessment & Plan Note (Signed)
Taking monthly vitamin B12 injections at home. Will request labs and notes from prior PCP.

## 2021-04-03 NOTE — Assessment & Plan Note (Signed)
Chronic, stable. BP 131/65 at today's visit. Continue valsartan 40mg  daily. Last CMP reviewed from September. Follow up in 6 months.

## 2021-04-08 ENCOUNTER — Encounter: Payer: Self-pay | Admitting: Nurse Practitioner

## 2021-04-11 ENCOUNTER — Telehealth: Payer: Self-pay | Admitting: Nurse Practitioner

## 2021-04-11 NOTE — Telephone Encounter (Signed)
Caller Name: Chanita Boden Call back phone #: [phone]  MEDICATION(S): sertraline (ZOLOFT) 100 MG tablet   Has the patient contacted their pharmacy (YES/NO)? Yes, they requested it come from new provider Ander Purpura).   Preferred Pharmacy: [facility] (different then what is in chart)  ~~~Please advise patient/caregiver to allow 2-3 business days to process RX refills.

## 2021-04-13 NOTE — Progress Notes (Signed)
Cardiology Office Note:    Date:  04/14/2021   ID:  Jade Williams, DOB Apr 07, 1950, MRN [id number]  PCP:  Jade Dancer, NP  Cardiologist:  None   Referring MD: Jade Bien, MD   Chief complaint: Follow-up hyperlipidemia, shortness of breath, chest pain   History of Present Illness:    Jade Williams is a 71 y.o. female with a hx of hyperlipidemia, primary hypertension, aortic atherosclerosis, and family history of CAD.  He is asymptomatic relative to cardiac symptoms.  She denies orthopnea, PND, exertional dyspnea, edema, palpitations, and recurrent chest pain.  She was seen 2 years ago with complaints of precordial chest pain with radiation into her neck.  She has subsequently been diagnosed with a large hiatal hernia.  She feels that much of her symptoms were likely related to the hiatal hernia.  She leads an active lifestyle.  Exertion does not produce chest discomfort.  Past Medical History:  Diagnosis Date   Anxiety    Back pain    Barrett's esophagus 01/17/2021   Depression    GERD (gastroesophageal reflux disease)    Hyperlipidemia    Hypertension    Osteopenia 11/14/2019   T score -1.6   Pernicious anemia     Past Surgical History:  Procedure Laterality Date   APPENDECTOMY     HEMORRHOID BANDING     TONSILLECTOMY     WISDOM TOOTH EXTRACTION      Current Medications: No outpatient medications have been marked as taking for the 04/14/21 encounter (Appointment) with Belva Crome, MD.     Allergies:   Sulfa antibiotics   Social History   Socioeconomic History   Marital status: Single    Spouse name: Not on file   Number of children: Not on file   Years of education: Not on file   Highest education level: Not on file  Occupational History   Not on file  Tobacco Use   Smoking status: Never   Smokeless tobacco: Never  Vaping Use   Vaping Use: Never used  Substance and Sexual Activity   Alcohol use: Yes    Comment: ocassional  wine   Drug use: No   Sexual activity: Not on file  Other Topics Concern   Not on file  Social History Narrative   Not on file   Social Determinants of Health   Financial Resource Strain: Not on file  Food Insecurity: Not on file  Transportation Needs: Not on file  Physical Activity: Not on file  Stress: Not on file  Social Connections: Not on file     Family History: The patient's family history includes Cancer in her father and sister; Heart disease in her mother; Hyperlipidemia in her mother; Hypertension in her mother; Macular degeneration in her mother; Stroke in her mother. There is no history of Colon cancer, Colon polyps, Esophageal cancer, Rectal cancer, or Stomach cancer.  ROS:   Please see the history of present illness.    No specific current complaints.  Purposeful 20 pound weight loss after the diagnosis of hiatal hernia high-dose statin therapy led to muscle aches.  Untreated lipids reveal LDL cholesterols greater than 180.  On 10 mg of Lipitor the most recent LDL is 130.  All other systems reviewed and are negative.  EKGs/Labs/Other Studies Reviewed:    The following studies were reviewed today: [facility] screening: Carotid plaque noted  CT Angio CHEST, Abdomen, and Pelvis 10/2020: IMPRESSION: 1. Thoracoabdominal aortic atherosclerosis without aneurysm or acute aortic findings. 2.  Moderate to large hiatal hernia with greater than 50% of the stomach being intrathoracic. No gastric outlet obstruction or inflammation. 3. Indeterminate 10 mm right adrenal nodule. This is probably benign adenoma. Consider 12 month follow-up CT.   Aortic Atherosclerosis (ICD10-I70.0).   NUCLEAR SCINTIGRAPHY 2020: Study Highlights    Nuclear stress EF: 70%. Blood pressure demonstrated a blunted response to exercise. There was no ST segment deviation noted during stress. This is a low risk study. The left ventricular ejection fraction is hyperdynamic (>65%).   Normal stress  nuclear study; patient did experience chest pain during the treadmill but perfusion is normal with no ischemia or infarction.  Gated ejection fraction 70% with normal wall motion.  EKG:  EKG Performed 10/20/2020 demonstrates normal sinus rhythm with no significant abnormality.  Recent Labs: 02/05/2021: ALT 23; BUN 19; Creatinine 0.7; Hemoglobin 13.3; Platelets 223; Potassium 3.9; Sodium 142; TSH 2.16  Recent Lipid Panel    Component Value Date/Time   CHOL 212 (A) 02/05/2021 0000   TRIG 112 02/05/2021 0000   HDL 64 02/05/2021 0000   LDLCALC 126 02/05/2021 0000    Physical Exam:    BP 112/68    Pulse 77    Ht 5\' 3"  (1.6 m)    Wt 137 lb 12.8 oz (62.5 kg)    SpO2 97%    BMI 24.41 kg/m      Wt Readings from Last 3 Encounters:  04/02/21 139 lb 9.6 oz (63.3 kg)  01/14/21 141 lb (64 kg)  01/08/21 141 lb 6.4 oz (64.1 kg)     GEN: Wearing a mask.. No acute distress HEENT: Normal NECK: No JVD. LYMPHATICS: No lymphadenopathy CARDIAC: 1-2/6 right upper sternal systolic murmur. RRR no gallop, or edema. VASCULAR:  Normal Pulses. No bruits. RESPIRATORY:  Clear to auscultation without rales, wheezing or rhonchi  ABDOMEN: Soft, non-tender, non-distended, No pulsatile mass, MUSCULOSKELETAL: No deformity  SKIN: Warm and dry NEUROLOGIC:  Alert and oriented x 3 PSYCHIATRIC:  Normal affect   ASSESSMENT:    1. Primary hypertension   2. Hyperlipidemia, unspecified hyperlipidemia type   3. Large hiatal hernia   4. Aortic atherosclerosis (HCC)    PLAN:    In order of problems listed above:  Blood pressure is currently excellent with attribution from weight loss. LDL is still greater than target which should be less than 70.  Increase atorvastatin to 20 mg/day which is what she would allow.  She has had previous difficulty with higher dose statin therapy.  We are not able to get LDL less than 70 will refer to the lipid clinic. Recent diagnosis and being followed by GI and primary  care. Aggressive primary/secondary risk prevention discussed.   Overall education and awareness concerning secondary risk prevention was discussed in detail: LDL less than 70, hemoglobin A1c less than 7, blood pressure target less than 130/80 mmHg, >150 minutes of moderate aerobic activity per week, avoidance of smoking, weight control (via diet and exercise), and continued surveillance/management of/for obstructive sleep apnea.  Extensive conversation concerning the presence of atherosclerosis on CT, the marked elevation in cholesterol and the interaction between lipids and atherosclerosis.  Also explained that the nuclear study was low risk but that it is abnormal only if there is greater than 80% stenosis in one of the major arteries.  Medication Adjustments/Labs and Tests Ordered: Current medicines are reviewed at length with the patient today.  Concerns regarding medicines are outlined above.  No orders of the defined types were placed in this encounter.  No orders of the defined types were placed in this encounter.   There are no Patient Instructions on file for this visit.   Signed, Sinclair Grooms, MD  04/14/2021 5:07 PM    [facility]

## 2021-04-14 ENCOUNTER — Encounter: Payer: Self-pay | Admitting: Interventional Cardiology

## 2021-04-14 ENCOUNTER — Other Ambulatory Visit: Payer: Self-pay

## 2021-04-14 ENCOUNTER — Ambulatory Visit (INDEPENDENT_AMBULATORY_CARE_PROVIDER_SITE_OTHER): Payer: Medicare Other | Admitting: Interventional Cardiology

## 2021-04-14 VITALS — BP 112/68 | HR 77 | Ht 63.0 in | Wt 137.8 lb

## 2021-04-14 DIAGNOSIS — E785 Hyperlipidemia, unspecified: Secondary | ICD-10-CM

## 2021-04-14 DIAGNOSIS — I700 Atherosclerosis of aorta: Secondary | ICD-10-CM

## 2021-04-14 DIAGNOSIS — K449 Diaphragmatic hernia without obstruction or gangrene: Secondary | ICD-10-CM | POA: Diagnosis not present

## 2021-04-14 DIAGNOSIS — I10 Essential (primary) hypertension: Secondary | ICD-10-CM | POA: Diagnosis not present

## 2021-04-14 MED ORDER — SERTRALINE HCL 100 MG PO TABS: 100.0000 mg | ORAL_TABLET | Freq: Every day | ORAL | 1 refills | Status: DC

## 2021-04-14 MED ORDER — ATORVASTATIN CALCIUM 20 MG PO TABS
20.0000 mg | ORAL_TABLET | Freq: Every day | ORAL | 3 refills | Status: DC
Start: 2021-04-14 — End: 2021-05-02

## 2021-04-14 NOTE — Patient Instructions (Signed)
Medication Instructions:  1) INCREASE Atorvastatin to 20mg  once daily  *If you need a refill on your cardiac medications before your next appointment, please call your pharmacy*   Lab Work: Lipid and Liver in 6-8 weeks.  You will need to be fasting for these labs (nothing to eat or drink after midnight except water and black coffee).  If you have labs (blood work) drawn today and your tests are completely normal, you will receive your results only by: [place] (if you have MyChart) OR A paper copy in the mail If you have any lab test that is abnormal or we need to change your treatment, we will call you to review the results.   Testing/Procedures: None   Follow-Up: At [facility], you and your health needs are our priority.  As part of our continuing mission to provide you with exceptional heart care, we have created designated Provider Care Teams.  These Care Teams include your primary Cardiologist (physician) and Advanced Practice Providers (APPs -  Physician Assistants and Nurse Practitioners) who all work together to provide you with the care you need, when you need it.  We recommend signing up for the patient portal called "MyChart".  Sign up information is provided on this After Visit Summary.  MyChart is used to connect with patients for Virtual Visits (Telemedicine).  Patients are able to view lab/test results, encounter notes, upcoming appointments, etc.  Non-urgent messages can be sent to your provider as well.   To learn more about what you can do with MyChart, go to [email or web address].    Your next appointment:   1 year(s)  The format for your next appointment:   In Person  Provider:   [facility]. Blenda Bridegroom, MD    Other Instructions

## 2021-04-15 ENCOUNTER — Encounter (HOSPITAL_COMMUNITY): Payer: Self-pay | Admitting: Gastroenterology

## 2021-04-18 ENCOUNTER — Telehealth: Payer: Self-pay | Admitting: Gastroenterology

## 2021-04-18 NOTE — Telephone Encounter (Signed)
The pt was scheduled while at her office visit will send to [facility].  ?

## 2021-04-18 NOTE — Telephone Encounter (Signed)
Patient called and stated that she is not able to do her procedure on 3/8 at [facility]. Would like a returned call to discuss a different date and time. Please advise.  ?

## 2021-04-21 NOTE — Progress Notes (Signed)
Pt called and wished to reschedule esophageal manometry for this Wednesday . Pt will call with office and reschedule.  ?

## 2021-04-22 NOTE — Telephone Encounter (Signed)
Returned call to patient and she would like to cancel manometry for now. Pt states that she will call us back when she is ready to schedule. Procedure has been cancelled at hospital. ?

## 2021-04-23 ENCOUNTER — Encounter (HOSPITAL_COMMUNITY): Admission: RE | Payer: Self-pay | Source: Home / Self Care

## 2021-04-23 ENCOUNTER — Ambulatory Visit (HOSPITAL_COMMUNITY): Admission: RE | Admit: 2021-04-23 | Payer: Medicare Other | Source: Home / Self Care | Admitting: Gastroenterology

## 2021-04-23 SURGERY — MANOMETRY, ESOPHAGUS

## 2021-04-29 ENCOUNTER — Telehealth: Payer: Self-pay | Admitting: Nurse Practitioner

## 2021-04-29 NOTE — Telephone Encounter (Signed)
Pt is wanting Jade Williams to change her cardiologist script for atorvastatin (LIPITOR) 20 MG tablet [[id number]]  by Dr. Daneen Schick to '10mg'$ . I let her know she should call her cardio doc, she said he won't change it. She wants Jade Williams's opinion. Please advise pt at [phone] ?

## 2021-05-02 MED ORDER — ATORVASTATIN CALCIUM 10 MG PO TABS: 10.0000 mg | ORAL_TABLET | Freq: Every day | ORAL | 1 refills | Status: DC

## 2021-05-02 NOTE — Telephone Encounter (Signed)
Called and explained to patient provider will send in rx for atorvastatin '10mg'$  due to side effects. Pt informed she will need to f/u with her cardiologist. Per provider no known contraindications with taking citrus bergamot and statin medication. New referral will be placed for ENT specialist. Sw, cma ?

## 2021-05-02 NOTE — Addendum Note (Signed)
Addended by: Vance Peper A on: 05/02/2021 08:48 PM ? ? Modules accepted: Orders ? ?

## 2021-05-07 ENCOUNTER — Encounter: Payer: Self-pay | Admitting: Gastroenterology

## 2021-06-09 ENCOUNTER — Other Ambulatory Visit: Payer: Medicare Other

## 2021-08-12 ENCOUNTER — Telehealth: Payer: Self-pay | Admitting: Nurse Practitioner

## 2021-08-12 DIAGNOSIS — E785 Hyperlipidemia, unspecified: Secondary | ICD-10-CM

## 2021-08-13 NOTE — Telephone Encounter (Signed)
Pt stated Cardiologist recommended she increase her dosage of atorvastatin from '10mg'$  to '20mg'$  a day, pt stated she felt depressed after taking and wanted cholesterol checked to see if levels decreased so she doesn't have to take such a high dosage. Pt scheduled a lab visit Friday '@9am'$ . Sw, cma

## 2021-08-15 ENCOUNTER — Encounter: Payer: Self-pay | Admitting: Nurse Practitioner

## 2021-08-15 ENCOUNTER — Ambulatory Visit (INDEPENDENT_AMBULATORY_CARE_PROVIDER_SITE_OTHER): Payer: Medicare Other | Admitting: Nurse Practitioner

## 2021-08-15 ENCOUNTER — Other Ambulatory Visit: Payer: Medicare Other

## 2021-08-15 VITALS — BP 132/79 | HR 70 | Temp 98.2°F | Wt 142.0 lb

## 2021-08-15 DIAGNOSIS — H9312 Tinnitus, left ear: Secondary | ICD-10-CM | POA: Diagnosis not present

## 2021-08-15 DIAGNOSIS — L209 Atopic dermatitis, unspecified: Secondary | ICD-10-CM

## 2021-08-15 DIAGNOSIS — E785 Hyperlipidemia, unspecified: Secondary | ICD-10-CM

## 2021-08-15 LAB — LIPID PANEL
Cholesterol: 168 mg/dL (ref 0–200)
HDL: 58.4 mg/dL (ref >39.00)
LDL Cholesterol: 78 mg/dL (ref 0–99)
NonHDL: 109.23
Total CHOL/HDL Ratio: 3
Triglycerides: 154 mg/dL — ABNORMAL HIGH (ref 0.0–149.0)
VLDL: 30.8 mg/dL (ref 0.0–40.0)

## 2021-08-15 LAB — COMPREHENSIVE METABOLIC PANEL
ALT: 20 U/L (ref 0–35)
AST: 24 U/L (ref 0–37)
Albumin: 4.3 g/dL (ref 3.5–5.2)
Alkaline Phosphatase: 124 U/L — ABNORMAL HIGH (ref 39–117)
BUN: 16 mg/dL (ref 6–23)
CO2: 31 mEq/L (ref 19–32)
Calcium: 9.5 mg/dL (ref 8.4–10.5)
Chloride: 100 mEq/L (ref 96–112)
Creatinine, Ser: 0.83 mg/dL (ref 0.40–1.20)
GFR: 70.24 mL/min (ref >60.00)
Glucose, Bld: 90 mg/dL (ref 70–99)
Potassium: 4 mEq/L (ref 3.5–5.1)
Sodium: 138 mEq/L (ref 135–145)
Total Bilirubin: 0.6 mg/dL (ref 0.2–1.2)
Total Protein: 7 g/dL (ref 6.0–8.3)

## 2021-08-15 MED ORDER — ATORVASTATIN CALCIUM 10 MG PO TABS: 10.0000 mg | ORAL_TABLET | Freq: Every day | ORAL | 1 refills | Status: DC

## 2021-08-15 NOTE — Patient Instructions (Signed)
It was great to see you!  Continue using the gold bond cream as needed for itching. The rash should go away in the next week.   I have attached information on the tinnitus clinic at [facility].   Let's follow-up with any concerns.   Take care,  Vance Peper, NP

## 2021-08-15 NOTE — Progress Notes (Signed)
   Acute Office Visit  Subjective:     Patient ID: Jade Williams, female    DOB: 05-16-1950, 71 y.o.   MRN: [id number]  Chief Complaint  Patient presents with   Poison Ivy    Pt c/o rash-like, itching red splotches  on RT forearm x2 wks possibly due to poison Ivy    HPI Patient is in today for rash on her arm.   RASH  Duration:  weeks  Location: arms  Itching: yes Burning: yes Redness: yes Oozing: no Scaling: no Blisters: no Painful: no Fevers: no Change in detergents/soaps/personal care products: no Recent illness: no Recent travel:no History of same: no Context: better Alleviating factors: lotion/moisturizer Treatments attempted:lotion/moisturizer Shortness of breath: no  Throat/tongue swelling: no Myalgias/arthralgias: no  She has also been experiencing ongoing tinnitus in her left ear.  She states that when the room is quiet it makes it worse.  She is able to sleep at night.  She states that sometimes her hearing sounds muffled.  She denies ear pain  ROS See pertinent positives and negatives per HPI.     Objective:    BP 132/79   Pulse 70   Temp 98.2 F (36.8 C) (Temporal)   Wt 142 lb (64.4 kg)   SpO2 96%   BMI 25.15 kg/m    Physical Exam Vitals and nursing note reviewed.  Constitutional:      General: She is not in acute distress.    Appearance: Normal appearance.  HENT:     Head: Normocephalic.     Right Ear: Tympanic membrane, ear canal and external ear normal.     Left Ear: Tympanic membrane, ear canal and external ear normal.  Eyes:     Conjunctiva/sclera: Conjunctivae normal.  Pulmonary:     Effort: Pulmonary effort is normal.  Musculoskeletal:     Cervical back: Normal range of motion.  Skin:    General: Skin is warm.     Findings: Rash present.     Comments: Raised, red circular patches to right forearm  Neurological:     General: No focal deficit present.     Mental Status: She is alert and oriented to person, place, and  time.  Psychiatric:        Mood and Affect: Mood normal.        Behavior: Behavior normal.        Thought Content: Thought content normal.        Judgment: Judgment normal.       Assessment & Plan:   Problem List Items Addressed This Visit       Other   Tinnitus of left ear    Chronic, ongoing. Discussed having low level noise on in the background like tv or radio. Gave her information on the tinnitus clinic at [facility].       Other Visit Diagnoses     Atopic dermatitis, unspecified type    -  Primary   Most likely poison ivy. Has been ongoing for 2 weeks. Continue the gold bond anti itch cream. F/U if worsens or doesn't go away in next 2 weeks.        Meds ordered this encounter  Medications   atorvastatin (LIPITOR) 10 MG tablet    Sig: Take 1 tablet (10 mg total) by mouth daily.    Dispense:  90 tablet    Refill:  1    Return if symptoms worsen or fail to improve.  Charyl Dancer, NP

## 2021-08-15 NOTE — Assessment & Plan Note (Signed)
Chronic, ongoing. Discussed having low level noise on in the background like tv or radio. Gave her information on the tinnitus clinic at [facility].

## 2021-08-16 LAB — HEPATIC FUNCTION PANEL
ALT: 21 IU/L (ref 0–32)
AST: 23 IU/L (ref 0–40)
Albumin: 4.4 g/dL (ref 3.7–4.7)
Alkaline Phosphatase: 144 IU/L — ABNORMAL HIGH (ref 44–121)
Bilirubin Total: 0.5 mg/dL (ref 0.0–1.2)
Bilirubin, Direct: 0.15 mg/dL (ref 0.00–0.40)
Total Protein: 6.7 g/dL (ref 6.0–8.5)

## 2021-08-18 ENCOUNTER — Telehealth: Payer: Self-pay | Admitting: Nurse Practitioner

## 2021-08-18 NOTE — Telephone Encounter (Signed)
Called and spoke to pt regarding lab results. Pt voiced understanding. Sw, cma

## 2021-08-18 NOTE — Telephone Encounter (Signed)
Pt had labs last week. She saw the results but nothing about cholesterol. She thinks the wrong labs were ordered or taken. Please call her. She is anxious.

## 2021-09-24 ENCOUNTER — Telehealth: Payer: Self-pay | Admitting: Nurse Practitioner

## 2021-09-24 NOTE — Telephone Encounter (Signed)
Caller Name: Ashiah Karpowicz Call back phone #: [phone]  Reason for Call: Pt asked for a call back, I just cancelled an appt for her August 16th per her request. She had some questions specifically for cma before rescheduling.

## 2021-09-26 NOTE — Telephone Encounter (Signed)
Called and LVM for patient to cb. Sw, cma

## 2021-10-01 ENCOUNTER — Ambulatory Visit: Payer: Medicare Other | Admitting: Nurse Practitioner

## 2021-10-09 ENCOUNTER — Telehealth: Payer: Self-pay | Admitting: Nurse Practitioner

## 2021-10-09 DIAGNOSIS — H9190 Unspecified hearing loss, unspecified ear: Secondary | ICD-10-CM

## 2021-10-09 NOTE — Telephone Encounter (Signed)
Pt is needing a referral to [facility], [place]  Address: [address] [place], [place] [address]  Phone: [phone] for a hearing test. Please advise pt [phone]

## 2021-10-10 NOTE — Telephone Encounter (Signed)
Called and confirmed with pt referral was sent, pt stated she is already scheduled for an appt with specialist. Sw, cma

## 2021-10-29 ENCOUNTER — Ambulatory Visit (INDEPENDENT_AMBULATORY_CARE_PROVIDER_SITE_OTHER): Payer: Medicare Other | Admitting: Nurse Practitioner

## 2021-10-29 ENCOUNTER — Encounter: Payer: Self-pay | Admitting: Nurse Practitioner

## 2021-10-29 VITALS — BP 118/72 | HR 69 | Temp 97.4°F | Wt 147.0 lb

## 2021-10-29 DIAGNOSIS — J069 Acute upper respiratory infection, unspecified: Secondary | ICD-10-CM | POA: Diagnosis not present

## 2021-10-29 NOTE — Progress Notes (Signed)
   Acute Office Visit  Subjective:     Patient ID: Jade Williams, female    DOB: 1950/04/15, 71 y.o.   MRN: [id number]  Chief Complaint  Patient presents with   Acute Visit    Pt c/o sinus drainage, nasal stuffiness, aching feeling in face, headache and cough, no fever. Pt tested neg for covid on 10/27/21 using at-home test    HPI Patient is in today for nasal congestion and headache for the past 3-6 days.  UPPER RESPIRATORY TRACT INFECTION  Fever: no Cough: yes Shortness of breath: no Wheezing: no Chest pain: no Chest tightness: no Chest congestion: no Nasal congestion: yes Runny nose: yes Post nasal drip: yes Sneezing: yes Sore throat:  scratchy  Swollen glands: yes Sinus pressure: yes Headache: yes Face pain: no Toothache: no Ear pain: no bilateral Ear pressure: yes bilateral Eyes red/itching:no Eye drainage/crusting: no  Vomiting: yes Rash: no Fatigue: yes Sick contacts: no Strep contacts: no  Context: better Recurrent sinusitis: no Relief with OTC cold/cough medications: yes  Treatments attempted: loratadine, aspirin, vitamin C   ROS See pertinent positives and negatives per HPI.     Objective:    BP 118/72   Pulse 69   Temp (!) 97.4 F (36.3 C) (Temporal)   Wt 147 lb (66.7 kg)   SpO2 96%   BMI 26.04 kg/m    Physical Exam Vitals and nursing note reviewed.  Constitutional:      General: She is not in acute distress.    Appearance: Normal appearance.  HENT:     Head: Normocephalic.     Right Ear: Tympanic membrane, ear canal and external ear normal.     Left Ear: Tympanic membrane, ear canal and external ear normal.     Nose:     Right Sinus: No maxillary sinus tenderness or frontal sinus tenderness.     Left Sinus: No maxillary sinus tenderness or frontal sinus tenderness.  Eyes:     Conjunctiva/sclera: Conjunctivae normal.  Cardiovascular:     Rate and Rhythm: Normal rate and regular rhythm.     Pulses: Normal pulses.     Heart  sounds: Normal heart sounds.  Pulmonary:     Effort: Pulmonary effort is normal.     Breath sounds: Normal breath sounds.  Musculoskeletal:     Cervical back: Normal range of motion and neck supple. No tenderness.  Lymphadenopathy:     Cervical: No cervical adenopathy.  Skin:    General: Skin is warm.  Neurological:     General: No focal deficit present.     Mental Status: She is alert and oriented to person, place, and time.  Psychiatric:        Mood and Affect: Mood normal.        Behavior: Behavior normal.        Thought Content: Thought content normal.        Judgment: Judgment normal.       Assessment & Plan:   Problem List Items Addressed This Visit   None Visit Diagnoses     Upper respiratory tract infection, unspecified type    -  Primary   Most likely viral. Continue OTC medications, fluids, and rest. Covid test at home negative. F/U if symptoms worsen or any concerns.        No orders of the defined types were placed in this encounter.   No follow-ups on file.  Charyl Dancer, NP

## 2021-10-29 NOTE — Patient Instructions (Signed)
It was great to see you!  Keep taking your saline nasal spray, aspirin, and loratadine for your symptoms. Make sure you are resting and drinking plenty of fluids  Please call or send a message with the dose of your valsartan and I will send in a 90 day supply to your pharmacy.   Let's follow-up if your symptoms worsen or don't improve.   Take care,  Vance Peper, NP

## 2021-11-13 ENCOUNTER — Other Ambulatory Visit: Payer: Self-pay | Admitting: Nurse Practitioner

## 2021-11-13 ENCOUNTER — Telehealth: Payer: Self-pay | Admitting: Nurse Practitioner

## 2021-11-13 NOTE — Telephone Encounter (Signed)
Caller Name: Inice Sanluis Call back phone #: [phone]  Reason for Call: Pt was prescribed a bp medication by A. Moller. She would like a call back to go over questions about how this got prescribed to her

## 2021-11-14 ENCOUNTER — Telehealth: Payer: Self-pay

## 2021-11-14 NOTE — Telephone Encounter (Signed)
Called and ldvm

## 2021-11-17 ENCOUNTER — Telehealth: Payer: Self-pay | Admitting: Nurse Practitioner

## 2021-11-17 NOTE — Telephone Encounter (Signed)
Caller Name: Paulette Rockford Call back phone #: [phone]  Reason for Call: Medication valsartan '160mg'$  is what pt says she is supposed to be taking for her blood pressure. She doesn't understand why this is being denied to her after it was prescribed from a provider A Mouler. She does not remember which practice this came from

## 2021-11-18 MED ORDER — VALSARTAN 40 MG PO TABS: 40.0000 mg | ORAL_TABLET | Freq: Every day | ORAL | 1 refills | Status: DC

## 2021-11-18 NOTE — Telephone Encounter (Signed)
Called and refilled pt valsartan 90-day supply and sent to preferred pharmacy. Sw, cma

## 2021-11-23 ENCOUNTER — Other Ambulatory Visit: Payer: Self-pay | Admitting: Nurse Practitioner

## 2021-11-24 ENCOUNTER — Ambulatory Visit (INDEPENDENT_AMBULATORY_CARE_PROVIDER_SITE_OTHER): Payer: Medicare Other | Admitting: Nurse Practitioner

## 2021-11-24 ENCOUNTER — Encounter: Payer: Self-pay | Admitting: Nurse Practitioner

## 2021-11-24 VITALS — BP 130/82 | HR 68 | Temp 96.1°F | Wt 145.2 lb

## 2021-11-24 DIAGNOSIS — R350 Frequency of micturition: Secondary | ICD-10-CM

## 2021-11-24 LAB — POCT URINALYSIS DIPSTICK
Bilirubin, UA: NEGATIVE
Glucose, UA: NEGATIVE
Ketones, UA: NEGATIVE
Nitrite, UA: POSITIVE
Protein, UA: NEGATIVE
Spec Grav, UA: >=1.03 — AB (ref 1.010–1.025)
Urobilinogen, UA: 1 E.U./dL
pH, UA: 5 (ref 5.0–8.0)

## 2021-11-24 MED ORDER — CIPROFLOXACIN HCL 250 MG PO TABS: 250.0000 mg | ORAL_TABLET | Freq: Two times a day (BID) | ORAL | 0 refills | Status: AC

## 2021-11-24 MED ORDER — VALSARTAN 160 MG PO TABS: 160.0000 mg | ORAL_TABLET | Freq: Every day | ORAL | 3 refills | Status: DC

## 2021-11-24 NOTE — Patient Instructions (Signed)
It was great to see you!  Start cipro antibiotic twice a day for 7 days. Make sure you are drinking plenty of fluids.   Let's follow-up if your symptoms worsen or don't improve.   Take care,  Vance Peper, NP

## 2021-11-24 NOTE — Progress Notes (Signed)
Acute Office Visit  Subjective:     Patient ID: Jade Williams, female    DOB: [birth year], 71 y.o.   MRN: [id number]  Chief Complaint  Patient presents with   Urinary Tract Infection    Pt c/o possible UTI w/ urinary frequency/incontinence x2 wks. Pt states she has to urinate 2-3x a night. Denies pain/or pressure.    HPI:  Patient is in today for urinary symptoms. She endorses urinary frequency and urinary incontinence. She endorses some pressure, denies burning, fever, back pain, nausea, and vomiting. She took some cranberry gummies and azo which helped.   ROS See pertinent positives and negatives per HPI.     Objective:    BP 130/82 (BP Location: Left Arm, Cuff Size: Normal)   Pulse 68   Temp (!) 96.1 F (35.6 C) (Temporal)   Wt 145 lb 3.2 oz (65.9 kg)   SpO2 98%   BMI 25.72 kg/m    Physical Exam Vitals and nursing note reviewed.  Constitutional:      General: She is not in acute distress.    Appearance: Normal appearance.  HENT:     Head: Normocephalic.  Eyes:     Conjunctiva/sclera: Conjunctivae normal.  Cardiovascular:     Rate and Rhythm: Normal rate and regular rhythm.     Pulses: Normal pulses.     Heart sounds: Normal heart sounds.  Pulmonary:     Effort: Pulmonary effort is normal.     Breath sounds: Normal breath sounds.  Abdominal:     General: There is no distension.     Palpations: Abdomen is soft.     Tenderness: There is no abdominal tenderness. There is no right CVA tenderness, left CVA tenderness or guarding.  Musculoskeletal:     Cervical back: Normal range of motion.  Skin:    General: Skin is warm.  Neurological:     General: No focal deficit present.     Mental Status: She is alert and oriented to person, place, and time.  Psychiatric:        Mood and Affect: Mood normal.        Behavior: Behavior normal.        Thought Content: Thought content normal.        Judgment: Judgment normal.    Results for orders placed or  performed in visit on 11/24/21  POCT Urinalysis Dipstick  Result Value Ref Range   Color, UA Orange    Clarity, UA cloudy    Glucose, UA Negative Negative   Bilirubin, UA neg    Ketones, UA neg    Spec Grav, UA >=1.030 (A) 1.010 - 1.025   Blood, UA trace    pH, UA 5.0 5.0 - 8.0   Protein, UA Negative Negative   Urobilinogen, UA 1.0 0.2 or 1.0 E.U./dL   Nitrite, UA POS    Leukocytes, UA Trace (A) Negative   Appearance     Odor          Assessment & Plan:   Problem List Items Addressed This Visit   None Visit Diagnoses     Urinary frequency    -  Primary   U/A showed trace leukocytes. With symptoms will add on urine culture and treat for UTI with Cipro BIDx7 days. Encourage fluids. F/U if not improving.    Relevant Orders   POCT Urinalysis Dipstick (Completed)   Urine Culture       Meds ordered this encounter  Medications   valsartan (DIOVAN) 160  MG tablet    Sig: Take 1 tablet (160 mg total) by mouth daily.    Dispense:  90 tablet    Refill:  3   ciprofloxacin (CIPRO) 250 MG tablet    Sig: Take 1 tablet (250 mg total) by mouth 2 (two) times daily for 7 days.    Dispense:  14 tablet    Refill:  0    Return if symptoms worsen or fail to improve.  Charyl Dancer, NP

## 2021-11-25 LAB — URINE CULTURE
MICRO NUMBER:: 14025598
Result:: NO GROWTH
SPECIMEN QUALITY:: ADEQUATE

## 2021-12-22 ENCOUNTER — Ambulatory Visit: Payer: Medicare Other

## 2022-01-26 ENCOUNTER — Ambulatory Visit (INDEPENDENT_AMBULATORY_CARE_PROVIDER_SITE_OTHER): Payer: Medicare Other

## 2022-01-26 VITALS — Ht 63.5 in | Wt 143.0 lb

## 2022-01-26 DIAGNOSIS — Z0000 Encounter for general adult medical examination without abnormal findings: Secondary | ICD-10-CM

## 2022-01-26 NOTE — Progress Notes (Signed)
I connected with Jade Williams today by telephone and verified that I am speaking with the correct person using two identifiers. Location patient: home Location provider: work Persons participating in the virtual visit: Jade Williams, Glenna Durand LPN.   I discussed the limitations, risks, security and privacy concerns of performing an evaluation and management service by telephone and the availability of in person appointments. I also discussed with the patient that there may be a patient responsible charge related to this service. The patient expressed understanding and verbally consented to this telephonic visit.    Interactive audio and video telecommunications were attempted between this provider and patient, however failed, due to patient having technical difficulties OR patient did not have access to video capability.  We continued and completed visit with audio only.     Vital signs may be patient reported or missing.  Subjective:   Jade Williams is a 71 y.o. female who presents for an Initial Medicare Annual Wellness Visit.  Review of Systems     Cardiac Risk Factors include: advanced age (>36mn, >>38women);dyslipidemia;hypertension     Objective:    Today's Vitals   01/26/22 1427  Weight: 143 lb (64.9 kg)  Height: 5' 3.5" (1.613 m)   Body mass index is 24.93 kg/m.     01/26/2022    2:30 PM 10/20/2020    7:34 PM 05/09/2017    2:34 PM  Advanced Directives  Does Patient Have a Medical Advance Directive? Yes No Yes  Type of AParamedicof AOthoLiving will    Copy of HFox Pointin Chart? No - copy requested    Would patient like information on creating a medical advance directive?  No - Patient declined     Current Medications (verified) Outpatient Encounter Medications as of 01/26/2022  Medication Sig   atorvastatin (LIPITOR) 10 MG tablet Take 1 tablet (10 mg total) by mouth daily.   Bioflavonoid  Products (VITAMIN C) CHEW Chew by mouth.   calcium carbonate (OS-CAL - DOSED IN MG OF ELEMENTAL CALCIUM) 1250 (500 Ca) MG tablet Take 2 tablets by mouth daily.   cholecalciferol (VITAMIN D) 1000 UNITS tablet Take 5,000 Units by mouth daily.    Coenzyme Q10 (CO Q 10 PO) Take by mouth.   cyanocobalamin (,VITAMIN B-12,) 1000 MCG/ML injection INJECT 1 ML INTRAMUSCULARLY EVERY 30 DAYS   loratadine (CLARITIN) 10 MG tablet Take 10 mg by mouth as needed.   MAGNESIUM CARBONATE PO Take by mouth.   metroNIDAZOLE (METROCREAM) 0.75 % cream Apply topically 2 (two) times daily.   sertraline (ZOLOFT) 100 MG tablet TAKE 1 TABLET BY MOUTH EVERY DAY   valsartan (DIOVAN) 160 MG tablet TAKE 1 TABLET BY MOUTH EVERY DAY   vitamin E 400 UNIT capsule Take 400 Units by mouth daily.   valsartan (DIOVAN) 160 MG tablet Take 1 tablet (160 mg total) by mouth daily.   No facility-administered encounter medications on file as of 01/26/2022.    Allergies (verified) Sulfa antibiotics   History: Past Medical History:  Diagnosis Date   Anxiety    Back pain    Barrett's esophagus 01/17/2021   Depression    GERD (gastroesophageal reflux disease)    Hyperlipidemia    Hypertension    Osteopenia 11/14/2019   T score -1.6   Pernicious anemia    Past Surgical History:  Procedure Laterality Date   APPENDECTOMY     HEMORRHOID BANDING     TONSILLECTOMY     WISDOM TOOTH EXTRACTION  Family History  Problem Relation Age of Onset   Hyperlipidemia Mother    Hypertension Mother    Stroke Mother    Heart disease Mother    Macular degeneration Mother    Cancer Father        lung cancer   Cancer Sister        lung cancer   Colon cancer Neg Hx    Colon polyps Neg Hx    Esophageal cancer Neg Hx    Rectal cancer Neg Hx    Stomach cancer Neg Hx    Social History   Socioeconomic History   Marital status: Single    Spouse name: Not on file   Number of children: Not on file   Years of education: Not on file    Highest education level: Not on file  Occupational History   Not on file  Tobacco Use   Smoking status: Never   Smokeless tobacco: Never  Vaping Use   Vaping Use: Never used  Substance and Sexual Activity   Alcohol use: Not Currently    Comment: ocassional wine   Drug use: No   Sexual activity: Not on file  Other Topics Concern   Not on file  Social History Narrative   Not on file   Social Determinants of Health   Financial Resource Strain: Low Risk  (01/26/2022)   Overall Financial Resource Strain (CARDIA)    Difficulty of Paying Living Expenses: Not hard at all  Food Insecurity: No Food Insecurity (01/26/2022)   Hunger Vital Sign    Worried About Running Out of Food in the Last Year: Never true    Ran Out of Food in the Last Year: Never true  Transportation Needs: No Transportation Needs (01/26/2022)   PRAPARE - Hydrologist (Medical): No    Lack of Transportation (Non-Medical): No  Physical Activity: Sufficiently Active (01/26/2022)   Exercise Vital Sign    Days of Exercise per Week: 7 days    Minutes of Exercise per Session: 40 min  Stress: No Stress Concern Present (01/26/2022)   [place]    Feeling of Stress : Not at all  Social Connections: Not on file    Tobacco Counseling Counseling given: Not Answered   Clinical Intake:  Pre-visit preparation completed: Yes  Pain : No/denies pain     Nutritional Status: BMI of 19-24  Normal Nutritional Risks: None Diabetes: No  How often do you need to have someone help you when you read instructions, pamphlets, or other written materials from your doctor or pharmacy?: 1 - Never  Diabetic? no  Interpreter Needed?: No  Information entered by :: NAllen LPN   Activities of Daily Living    01/26/2022    2:31 PM 01/25/2022    3:44 PM  In your present state of health, do you have any difficulty performing the  following activities:  Hearing? 0 0  Vision? 0 0  Difficulty concentrating or making decisions? 0 0  Walking or climbing stairs? 0 0  Dressing or bathing? 0 0  Doing errands, shopping? 0 0  Preparing Food and eating ? N N  Using the Toilet? N N  In the past six months, have you accidently leaked urine? N N  Do you have problems with loss of bowel control? N N  Managing your Medications? N N  Managing your Finances? N N  Housekeeping or managing your Housekeeping? N N  Patient Care Team: Charyl Dancer, NP as PCP - General (Internal Medicine)  Indicate any recent Medical Services you may have received from other than [facility] providers in the past year (date may be approximate).     Assessment:   This is a routine wellness examination for Jade Williams.  Hearing/Vision screen Vision Screening - Comments:: Regular eye exams, [facility]  Dietary issues and exercise activities discussed: Current Exercise Habits: Home exercise routine, Type of exercise: walking, Time (Minutes): 40, Frequency (Times/Week): 7, Weekly Exercise (Minutes/Week): 280   Goals Addressed             This Visit's Progress    Patient Stated       01/26/2022, wants to lose a few pounds and working on strength and flexibilty       Depression Screen    01/26/2022    2:31 PM 04/02/2021    3:12 PM  PHQ 2/9 Scores  PHQ - 2 Score 0 0    Fall Risk    01/26/2022    2:31 PM 01/25/2022    3:44 PM 09/16/2018   10:44 AM  Comfort in the past year? 0 0   Comment   Emmi Telephone Survey: data to providers prior to load  Number falls in past yr: 0 0   Comment   Emmi Telephone Survey Actual Response =   Injury with Fall? 0 0   Risk for fall due to : Medication side effect    Follow up Falls prevention discussed;Education provided;Falls evaluation completed      FALL RISK PREVENTION PERTAINING TO THE HOME:  Any stairs in or around the home? No  If so, are there any without handrails?  N/a Home free of loose throw rugs in walkways, pet beds, electrical cords, etc? Yes  Adequate lighting in your home to reduce risk of falls? Yes   ASSISTIVE DEVICES UTILIZED TO PREVENT FALLS:  Life alert? No  Use of a cane, walker or w/c? No  Grab bars in the bathroom? No  Shower chair or bench in shower? No  Elevated toilet seat or a handicapped toilet? Yes   TIMED UP AND GO:  Was the test performed? No .      Cognitive Function:        01/26/2022    2:31 PM  6CIT Screen  What Year? 0 points  What month? 0 points  What time? 0 points  Count back from 20 0 points  Months in reverse 0 points  Repeat phrase 0 points  Total Score 0 points    Immunizations Immunization History  Administered Date(s) Administered   Hepatitis A 06/06/1998   Influenza-Unspecified 02/16/2018, 11/30/2020, 11/14/2021   PFIZER(Purple Top)SARS-COV-2 Vaccination 03/16/2019, 04/07/2019, 12/05/2019, 06/28/2020   Pfizer Covid-19 Vaccine Bivalent Booster 30yr & up 11/14/2020   Tdap 01/31/2013   Unspecified SARS-COV-2 Vaccination 01/26/2022    TDAP status: Up to date  Flu Vaccine status: Up to date  Pneumococcal vaccine status: Up to date  Covid-19 vaccine status: Completed vaccines  Qualifies for Shingles Vaccine? Yes   Zostavax completed No   Shingrix Completed?: Yes  Screening Tests Health Maintenance  Topic Date Due   Hepatitis C Screening  Never done   MAMMOGRAM  11/13/2021   Medicare Annual Wellness (AWV)  12/30/2021   COVID-19 Vaccine (7 - 2023-24 season) 03/23/2022   DTaP/Tdap/Td (2 - Td or Tdap) 02/01/2023   COLONOSCOPY (Pts 45-461yrInsurance coverage will need to be confirmed)  12/25/2025  INFLUENZA VACCINE  Completed   DEXA SCAN  Completed   HPV VACCINES  Aged Out   Pneumonia Vaccine 50+ Years old  Discontinued   Zoster Vaccines- Shingrix  Discontinued    Health Maintenance  Health Maintenance Due  Topic Date Due   Hepatitis C Screening  Never done   MAMMOGRAM   11/13/2021   Medicare Annual Wellness (AWV)  12/30/2021    Colorectal cancer screening: has cologuard kit  Mammogram status: patient to schedule  Bone Density status: Completed 10/25/2019.   Lung Cancer Screening: (Low Dose CT Chest recommended if Age 25-80 years, 30 pack-year currently smoking OR have quit w/in 15years.) does not qualify.   Lung Cancer Screening Referral: no  Additional Screening:  Hepatitis C Screening: does qualify;   Vision Screening: Recommended annual ophthalmology exams for early detection of glaucoma and other disorders of the eye. Is the patient up to date with their annual eye exam?  Yes  Who is the provider or what is the name of the office in which the patient attends annual eye exams? .[place] If pt is not established with a provider, would they like to be referred to a provider to establish care? No .   Dental Screening: Recommended annual dental exams for proper oral hygiene  Community Resource Referral / Chronic Care Management: CRR required this visit?  No   CCM required this visit?  No      Plan:     I have personally reviewed and noted the following in the patient's chart:   Medical and social history Use of alcohol, tobacco or illicit drugs  Current medications and supplements including opioid prescriptions. Patient is not currently taking opioid prescriptions. Functional ability and status Nutritional status Physical activity Advanced directives List of other physicians Hospitalizations, surgeries, and ER visits in previous 12 months Vitals Screenings to include cognitive, depression, and falls Referrals and appointments  In addition, I have reviewed and discussed with patient certain preventive protocols, quality metrics, and best practice recommendations. A written personalized care plan for preventive services as well as general preventive health recommendations were provided to patient.     Kellie Simmering,  LPN   [id number]   Nurse Notes: none  Due to this being a virtual visit, the after visit summary with patients personalized plan was offered to patient via mail or my-chart.  Patient would like to access on my-chart

## 2022-01-26 NOTE — Patient Instructions (Signed)
Jade Williams , Thank you for taking time to come for your Medicare Wellness Visit. I appreciate your ongoing commitment to your health goals. Please review the following plan we discussed and let me know if I can assist you in the future.   These are the goals we discussed:  Goals      Patient Stated     01/26/2022, wants to lose a few pounds and working on strength and flexibilty        This is a list of the screening recommended for you and due dates:  Health Maintenance  Topic Date Due   Hepatitis C Screening: USPSTF Recommendation to screen - Ages 52-79 yo.  Never done   Mammogram  11/13/2021   COVID-19 Vaccine (7 - 2023-24 season) 03/23/2022   Medicare Annual Wellness Visit  01/27/2023   DTaP/Tdap/Td vaccine (2 - Td or Tdap) 02/01/2023   Colon Cancer Screening  12/25/2025   Flu Shot  Completed   DEXA scan (bone density measurement)  Completed   HPV Vaccine  Aged Out   Pneumonia Vaccine  Discontinued   Zoster (Shingles) Vaccine  Discontinued    Advanced directives: Please bring a copy of your POA (Power of [place]) and/or Living Will to your next appointment.   Conditions/risks identified: none  Next appointment: Follow up in one year for your annual wellness visit    Preventive Care 65 Years and Older, Female Preventive care refers to lifestyle choices and visits with your health care provider that can promote health and wellness. What does preventive care include? A yearly physical exam. This is also called an annual well check. Dental exams once or twice a year. Routine eye exams. Ask your health care provider how often you should have your eyes checked. Personal lifestyle choices, including: Daily care of your teeth and gums. Regular physical activity. Eating a healthy diet. Avoiding tobacco and drug use. Limiting alcohol use. Practicing safe sex. Taking low-dose aspirin every day. Taking vitamin and mineral supplements as recommended by your health care  provider. What happens during an annual well check? The services and screenings done by your health care provider during your annual well check will depend on your age, overall health, lifestyle risk factors, and family history of disease. Counseling  Your health care provider may ask you questions about your: Alcohol use. Tobacco use. Drug use. Emotional well-being. Home and relationship well-being. Sexual activity. Eating habits. History of falls. Memory and ability to understand (cognition). Work and work Statistician. Reproductive health. Screening  You may have the following tests or measurements: Height, weight, and BMI. Blood pressure. Lipid and cholesterol levels. These may be checked every 5 years, or more frequently if you are over 59 years old. Skin check. Lung cancer screening. You may have this screening every year starting at age 45 if you have a 30-pack-year history of smoking and currently smoke or have quit within the past 15 years. Fecal occult blood test (FOBT) of the stool. You may have this test every year starting at age 21. Flexible sigmoidoscopy or colonoscopy. You may have a sigmoidoscopy every 5 years or a colonoscopy every 10 years starting at age 48. Hepatitis C blood test. Hepatitis B blood test. Sexually transmitted disease (STD) testing. Diabetes screening. This is done by checking your blood sugar (glucose) after you have not eaten for a while (fasting). You may have this done every 1-3 years. Bone density scan. This is done to screen for osteoporosis. You may have this done starting  at age 3. Mammogram. This may be done every 1-2 years. Talk to your health care provider about how often you should have regular mammograms. Talk with your health care provider about your test results, treatment options, and if necessary, the need for more tests. Vaccines  Your health care provider may recommend certain vaccines, such as: Influenza vaccine. This is  recommended every year. Tetanus, diphtheria, and acellular pertussis (Tdap, Td) vaccine. You may need a Td booster every 10 years. Zoster vaccine. You may need this after age 66. Pneumococcal 13-valent conjugate (PCV13) vaccine. One dose is recommended after age 46. Pneumococcal polysaccharide (PPSV23) vaccine. One dose is recommended after age 81. Talk to your health care provider about which screenings and vaccines you need and how often you need them. This information is not intended to replace advice given to you by your health care provider. Make sure you discuss any questions you have with your health care provider. Document Released: 03/01/2015 Document Revised: 10/23/2015 Document Reviewed: 12/04/2014 Elsevier Interactive Patient Education  2017 [place] Prevention in the Home Falls can cause injuries. They can happen to people of all ages. There are many things you can do to make your home safe and to help prevent falls. What can I do on the outside of my home? Regularly fix the edges of walkways and driveways and fix any cracks. Remove anything that might make you trip as you walk through a door, such as a raised step or threshold. Trim any bushes or trees on the path to your home. Use bright outdoor lighting. Clear any walking paths of anything that might make someone trip, such as rocks or tools. Regularly check to see if handrails are loose or broken. Make sure that both sides of any steps have handrails. Any raised decks and porches should have guardrails on the edges. Have any leaves, snow, or ice cleared regularly. Use sand or salt on walking paths during winter. Clean up any spills in your garage right away. This includes oil or grease spills. What can I do in the bathroom? Use night lights. Install grab bars by the toilet and in the tub and shower. Do not use towel bars as grab bars. Use non-skid mats or decals in the tub or shower. If you need to sit down in  the shower, use a plastic, non-slip stool. Keep the floor dry. Clean up any water that spills on the floor as soon as it happens. Remove soap buildup in the tub or shower regularly. Attach bath mats securely with double-sided non-slip rug tape. Do not have throw rugs and other things on the floor that can make you trip. What can I do in the bedroom? Use night lights. Make sure that you have a light by your bed that is easy to reach. Do not use any sheets or blankets that are too big for your bed. They should not hang down onto the floor. Have a firm chair that has side arms. You can use this for support while you get dressed. Do not have throw rugs and other things on the floor that can make you trip. What can I do in the kitchen? Clean up any spills right away. Avoid walking on wet floors. Keep items that you use a lot in easy-to-reach places. If you need to reach something above you, use a strong step stool that has a grab bar. Keep electrical cords out of the way. Do not use floor polish or wax that makes  floors slippery. If you must use wax, use non-skid floor wax. Do not have throw rugs and other things on the floor that can make you trip. What can I do with my stairs? Do not leave any items on the stairs. Make sure that there are handrails on both sides of the stairs and use them. Fix handrails that are broken or loose. Make sure that handrails are as long as the stairways. Check any carpeting to make sure that it is firmly attached to the stairs. Fix any carpet that is loose or worn. Avoid having throw rugs at the top or bottom of the stairs. If you do have throw rugs, attach them to the floor with carpet tape. Make sure that you have a light switch at the top of the stairs and the bottom of the stairs. If you do not have them, ask someone to add them for you. What else can I do to help prevent falls? Wear shoes that: Do not have high heels. Have rubber bottoms. Are comfortable  and fit you well. Are closed at the toe. Do not wear sandals. If you use a stepladder: Make sure that it is fully opened. Do not climb a closed stepladder. Make sure that both sides of the stepladder are locked into place. Ask someone to hold it for you, if possible. Clearly mark and make sure that you can see: Any grab bars or handrails. First and last steps. Where the edge of each step is. Use tools that help you move around (mobility aids) if they are needed. These include: Canes. Walkers. Scooters. Crutches. Turn on the lights when you go into a dark area. Replace any light bulbs as soon as they burn out. Set up your furniture so you have a clear path. Avoid moving your furniture around. If any of your floors are uneven, fix them. If there are any pets around you, be aware of where they are. Review your medicines with your doctor. Some medicines can make you feel dizzy. This can increase your chance of falling. Ask your doctor what other things that you can do to help prevent falls. This information is not intended to replace advice given to you by your health care provider. Make sure you discuss any questions you have with your health care provider. Document Released: 11/29/2008 Document Revised: 07/11/2015 Document Reviewed: 03/09/2014 Elsevier Interactive Patient Education  2017 [employer].

## 2022-01-30 ENCOUNTER — Telehealth: Payer: Self-pay | Admitting: Nurse Practitioner

## 2022-01-30 DIAGNOSIS — E785 Hyperlipidemia, unspecified: Secondary | ICD-10-CM

## 2022-01-30 NOTE — Telephone Encounter (Signed)
Pt is wanting orders for her to get her cholesterol checked. Please advise pt at [phone]

## 2022-01-30 NOTE — Telephone Encounter (Signed)
Called and spoke with pt informed pt the we have put in the order for labs and made an appt for labs , reminded pt she will need to fast  for labs.

## 2022-01-30 NOTE — Addendum Note (Signed)
Addended by: Vance Peper A on: 01/30/2022 12:53 PM   Modules accepted: Orders

## 2022-02-03 ENCOUNTER — Telehealth: Payer: Self-pay | Admitting: Nurse Practitioner

## 2022-02-03 ENCOUNTER — Other Ambulatory Visit: Payer: Self-pay

## 2022-02-03 DIAGNOSIS — Z13820 Encounter for screening for osteoporosis: Secondary | ICD-10-CM

## 2022-02-03 DIAGNOSIS — Z1231 Encounter for screening mammogram for malignant neoplasm of breast: Secondary | ICD-10-CM

## 2022-02-03 NOTE — Telephone Encounter (Signed)
Order placed

## 2022-02-03 NOTE — Telephone Encounter (Signed)
Pt is wanting a referral for her to get a mammogram and a bone density test. Please advise pt at [phone]

## 2022-02-03 NOTE — Addendum Note (Signed)
Addended by: Alphonzo Lemmings on: 02/03/2022 03:58 PM   Modules accepted: Orders

## 2022-02-05 ENCOUNTER — Telehealth: Payer: Self-pay

## 2022-02-05 NOTE — Telephone Encounter (Signed)
Signed orders for mammo and dexa scan faxed to [facility].

## 2022-02-10 ENCOUNTER — Other Ambulatory Visit: Payer: Medicare Other

## 2022-02-11 ENCOUNTER — Other Ambulatory Visit (INDEPENDENT_AMBULATORY_CARE_PROVIDER_SITE_OTHER): Payer: Medicare Other

## 2022-02-11 DIAGNOSIS — E785 Hyperlipidemia, unspecified: Secondary | ICD-10-CM | POA: Diagnosis not present

## 2022-02-11 LAB — LIPID PANEL
Cholesterol: 282 mg/dL — ABNORMAL HIGH (ref 0–200)
HDL: 55.5 mg/dL (ref >39.00)
NonHDL: 226.04
Total CHOL/HDL Ratio: 5
Triglycerides: 216 mg/dL — ABNORMAL HIGH (ref 0.0–149.0)
VLDL: 43.2 mg/dL — ABNORMAL HIGH (ref 0.0–40.0)

## 2022-02-11 LAB — LDL CHOLESTEROL, DIRECT: Direct LDL: 184 mg/dL

## 2022-02-13 LAB — HM DEXA SCAN

## 2022-02-17 ENCOUNTER — Other Ambulatory Visit: Payer: Self-pay | Admitting: Nurse Practitioner

## 2022-02-17 ENCOUNTER — Telehealth: Payer: Self-pay | Admitting: Nurse Practitioner

## 2022-02-17 MED ORDER — CYANOCOBALAMIN 1000 MCG/ML IJ SOLN: INTRAMUSCULAR | 11 refills | Status: DC

## 2022-02-17 NOTE — Telephone Encounter (Signed)
Called and spoke with patient regarding this pt  got a message from her pharmacy about Vit b12 stating that she need a follow up appt ,  pt said that she didn't understand why she needed a follow up appt when she just had an appt on  10/23. And she said that she had lab done in 02/11/22. I explain to patient   she will need come to discuss her labs  and follow 3 -6 month in order to keeping refills on medication, pt didn't understand, transfer call to [facility].

## 2022-02-17 NOTE — Telephone Encounter (Signed)
Pt called and stated that she would like the nure to give her a call . Pt have a question

## 2022-02-17 NOTE — Telephone Encounter (Signed)
Pt is wanting a cb concening this issue.

## 2022-02-17 NOTE — Telephone Encounter (Signed)
Pt has spoke with Asencion Partridge regarding this

## 2022-02-24 ENCOUNTER — Encounter: Payer: Self-pay | Admitting: Nurse Practitioner

## 2022-04-03 ENCOUNTER — Telehealth: Payer: Self-pay | Admitting: Nurse Practitioner

## 2022-04-03 NOTE — Telephone Encounter (Signed)
Caller Name: Nayara  Call back phone #: [phone]  Reason for Call: Pt suspects she has flu she has a headache feels tired and weak, this started on Tuesday. She would like to know if there is an antiviral med she can take

## 2022-04-07 ENCOUNTER — Telehealth: Payer: Self-pay | Admitting: Nurse Practitioner

## 2022-04-07 NOTE — Telephone Encounter (Signed)
I returned patient's call.

## 2022-04-07 NOTE — Telephone Encounter (Signed)
Patient said that she is ok and now back at work.

## 2022-04-07 NOTE — Telephone Encounter (Signed)
LVM for patient to return call. 

## 2022-04-07 NOTE — Telephone Encounter (Signed)
Pt returned your call please try again.

## 2022-04-27 ENCOUNTER — Encounter: Payer: Self-pay | Admitting: Nurse Practitioner

## 2022-04-27 ENCOUNTER — Ambulatory Visit (INDEPENDENT_AMBULATORY_CARE_PROVIDER_SITE_OTHER): Payer: Medicare Other | Admitting: Nurse Practitioner

## 2022-04-27 VITALS — BP 116/84 | HR 68 | Temp 98.3°F | Wt 148.6 lb

## 2022-04-27 DIAGNOSIS — J069 Acute upper respiratory infection, unspecified: Secondary | ICD-10-CM

## 2022-04-27 DIAGNOSIS — H1031 Unspecified acute conjunctivitis, right eye: Secondary | ICD-10-CM | POA: Diagnosis not present

## 2022-04-27 LAB — POC COVID19 BINAXNOW: SARS Coronavirus 2 Ag: NEGATIVE

## 2022-04-27 MED ORDER — CIPROFLOXACIN HCL 0.3 % OP SOLN: OPHTHALMIC | 0 refills | Status: DC

## 2022-04-27 NOTE — Patient Instructions (Addendum)
It was great to see you!  Start eye drop 1 drop, every 2 hours while awake in your right eye for 2 days, then every 4 hours while awake.   Continue over the counter medications and drinking water.  Let's follow-up if your symptoms worsen or don't improve.   Take care,  Vance Peper, NP

## 2022-04-27 NOTE — Progress Notes (Signed)
Acute Office Visit  Subjective:     Patient ID: Jade Williams, female    DOB: 12-18-1950, 72 y.o.   MRN: [id number]  Chief Complaint  Patient presents with   Cough    With sinus drainage, right eye irritation    HPI Patient is in today for sinus drainage and right eye irritation that started 4 days.  UPPER RESPIRATORY TRACT INFECTION  Fever: no Cough:  a little Shortness of breath: no Wheezing: no Chest pain: no Chest tightness: no Chest congestion: no Nasal congestion: yes Runny nose: yes Post nasal drip: yes Sneezing: yes Sore throat: yes - better Swollen glands: no Sinus pressure: yes Headache: yes Face pain: yes Toothache: no Ear pain: yes bilateral Ear pressure: yes bilateral Eyes red/itching:yes Eye drainage/crusting: yes - matted closed this morning Vomiting: no Rash: no Fatigue: yes Sick contacts:  works in school Strep contacts: no  Context: worse Recurrent sinusitis: no Relief with OTC cold/cough medications: no  Treatments attempted: aspirin, tussion dm    ROS See pertinent positives and negatives per HPI.     Objective:    BP 116/84 (BP Location: Left Arm)   Pulse 68   Temp 98.3 F (36.8 C)   Wt 148 lb 9.6 oz (67.4 kg)   SpO2 94%   BMI 25.91 kg/m     Physical Exam Vitals and nursing note reviewed.  Constitutional:      General: She is not in acute distress.    Appearance: Normal appearance.  HENT:     Head: Normocephalic.     Right Ear: Tympanic membrane, ear canal and external ear normal.     Left Ear: Tympanic membrane, ear canal and external ear normal.     Nose:     Right Sinus: No maxillary sinus tenderness or frontal sinus tenderness.     Left Sinus: No maxillary sinus tenderness or frontal sinus tenderness.  Eyes:     Conjunctiva/sclera:     Right eye: Right conjunctiva is injected. Exudate present.     Left eye: Left conjunctiva is not injected. No exudate. Cardiovascular:     Rate and Rhythm: Normal rate and  regular rhythm.     Pulses: Normal pulses.     Heart sounds: Normal heart sounds.  Pulmonary:     Effort: Pulmonary effort is normal.     Breath sounds: Normal breath sounds.  Musculoskeletal:     Cervical back: Normal range of motion and neck supple. Tenderness present.  Lymphadenopathy:     Cervical: No cervical adenopathy.  Skin:    General: Skin is warm.  Neurological:     General: No focal deficit present.     Mental Status: She is alert and oriented to person, place, and time.  Psychiatric:        Mood and Affect: Mood normal.        Behavior: Behavior normal.        Thought Content: Thought content normal.        Judgment: Judgment normal.     Results for orders placed or performed in visit on 04/27/22  POC COVID-19 BinaxNow  Result Value Ref Range   SARS Coronavirus 2 Ag Negative Negative        Assessment & Plan:   Problem List Items Addressed This Visit   None Visit Diagnoses     Acute bacterial conjunctivitis of right eye    -  Primary   Start cipro eye drops every 2 hours while awake for 2  days, then every 4 hours for 5 days. Can use cool compresses. Work note given. Contagious x24 hours   Upper respiratory tract infection, unspecified type       POC Covid negative. Continue fluids, rest and OTC medications. F/U if not improving.   Relevant Orders   POC COVID-19 BinaxNow (Completed)       Meds ordered this encounter  Medications   ciprofloxacin (CILOXAN) 0.3 % ophthalmic solution    Sig: Administer 1 drop, every 2 hours, while awake, for 2 days. Then 1 drop, every 4 hours, while awake, for the next 5 days.    Dispense:  10 mL    Refill:  0    Return if symptoms worsen or fail to improve.  Charyl Dancer, NP

## 2022-04-28 ENCOUNTER — Telehealth: Payer: Self-pay | Admitting: Nurse Practitioner

## 2022-04-28 NOTE — Telephone Encounter (Signed)
LVM for patient to return call. 

## 2022-04-28 NOTE — Telephone Encounter (Signed)
Pt missed her cb, please cb at [phone].

## 2022-04-28 NOTE — Telephone Encounter (Signed)
Caller Name: [place] Call back phone #: [phone]  Reason for Call: pt called stating eye infection has spread to both eyes and requesting additional drops to be sent in to [facility] on [address] [facility]

## 2022-04-29 ENCOUNTER — Encounter (HOSPITAL_BASED_OUTPATIENT_CLINIC_OR_DEPARTMENT_OTHER): Payer: Self-pay

## 2022-04-29 ENCOUNTER — Emergency Department (HOSPITAL_BASED_OUTPATIENT_CLINIC_OR_DEPARTMENT_OTHER): Payer: Medicare Other

## 2022-04-29 ENCOUNTER — Other Ambulatory Visit: Payer: Self-pay

## 2022-04-29 ENCOUNTER — Emergency Department (HOSPITAL_BASED_OUTPATIENT_CLINIC_OR_DEPARTMENT_OTHER)
Admission: EM | Admit: 2022-04-29 | Discharge: 2022-04-30 | Disposition: A | Discharge status: Home / Self Care | Payer: Medicare Other | Attending: Emergency Medicine | Admitting: Emergency Medicine

## 2022-04-29 DIAGNOSIS — I10 Essential (primary) hypertension: Secondary | ICD-10-CM | POA: Diagnosis not present

## 2022-04-29 DIAGNOSIS — J069 Acute upper respiratory infection, unspecified: Secondary | ICD-10-CM | POA: Diagnosis not present

## 2022-04-29 DIAGNOSIS — B9789 Other viral agents as the cause of diseases classified elsewhere: Secondary | ICD-10-CM | POA: Diagnosis not present

## 2022-04-29 DIAGNOSIS — Z20822 Contact with and (suspected) exposure to covid-19: Secondary | ICD-10-CM | POA: Diagnosis not present

## 2022-04-29 DIAGNOSIS — R059 Cough, unspecified: Secondary | ICD-10-CM | POA: Diagnosis present

## 2022-04-29 LAB — RESP PANEL BY RT-PCR (RSV, FLU A&B, COVID)  RVPGX2
Influenza A by PCR: NEGATIVE
Influenza B by PCR: NEGATIVE
Resp Syncytial Virus by PCR: NEGATIVE
SARS Coronavirus 2 by RT PCR: NEGATIVE

## 2022-04-29 NOTE — Telephone Encounter (Signed)
Patient notified that drops should be enough for both eyes. She was thankful.

## 2022-04-29 NOTE — ED Triage Notes (Signed)
Patient arrives to ED POV c/o sinus pressure/pain, cough and runny nose since Saturday. 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 Pt has been taking OTC medications with no relief. Hx of HTN. No other complaints at this time. Pt A/O x4.

## 2022-04-29 NOTE — ED Provider Notes (Signed)
Emergency Department Provider Note   I have reviewed the triage vital signs and the nursing notes.   HISTORY  Chief Complaint Cough   HPI Jade Williams is a 72 y.o. female past history of hypertension, GERD, hyperlipidemia presents to the emergency department with nasal congestion, cough, sore throat, sinus pain/pressure worsening over the last 7 days.  She is having moderate to severe pain earlier in the day which is improved in the face mainly.  Pain was diffuse.  No vision changes or severe headache.  She saw her PCP earlier in the week and has been undergoing supportive care at home. No CP or SOB but frequent cough. No abdominal pain or vomiting.    Past Medical History:  Diagnosis Date   Anxiety    Back pain    Barrett's esophagus 01/17/2021   Depression    GERD (gastroesophageal reflux disease)    Hyperlipidemia    Hypertension    Osteopenia 11/14/2019   T score -1.6   Pernicious anemia     Review of Systems  Constitutional: No fever/chills ENT: Positive sore throat and sinus pressure.  Cardiovascular: Denies chest pain. Respiratory: Denies shortness of breath. Positive cough.  Gastrointestinal: No abdominal pain.  No nausea, no vomiting.  No diarrhea.   Skin: Negative for rash.  ____________________________________________   PHYSICAL EXAM:  VITAL SIGNS: ED Triage Vitals  Enc Vitals Group     BP 04/29/22 2117 (!) 164/77     Pulse Rate 04/29/22 2117 80     Resp 04/29/22 2117 19     Temp 04/29/22 2117 98.7 F (37.1 C)     Temp Source 04/29/22 2117 Oral     SpO2 04/29/22 2117 96 %     Weight 04/29/22 2120 145 lb (65.8 kg)     Height 04/29/22 2120 5' 2.25" (1.581 m)   Constitutional: Alert and oriented. Well appearing and in no acute distress. Eyes: Conjunctivae are normal.  Head: Atraumatic. Nose: Positive congestion/rhinnorhea. Mouth/Throat: Mucous membranes are moist.  Oropharynx non-erythematous. Neck: No stridor.   Cardiovascular: Normal  rate, regular rhythm. Good peripheral circulation. Grossly normal heart sounds.   Respiratory: Normal respiratory effort.  No retractions. Lungs CTAB. Gastrointestinal: No distention.  Musculoskeletal: No gross deformities of extremities. Neurologic:  Normal speech and language.  Skin:  Skin is warm, dry and intact. No rash noted.  ____________________________________________   LABS (all labs ordered are listed, but only abnormal results are displayed)  Labs Reviewed  RESP PANEL BY RT-PCR (RSV, FLU A&B, COVID)  [id number]  GROUP A STREP BY PCR   ____________________________________________  RADIOLOGY  DG Chest 2 View  Result Date: 04/30/2022 CLINICAL DATA:  Cough and runny nose EXAM: CHEST - 2 VIEW COMPARISON:  CTA chest 10/20/2020 FINDINGS: Normal cardiomediastinal silhouette. Aortic atherosclerotic calcification. No focal consolidation, pleural effusion or pneumothorax. Eventration right hemidiaphragm. Moderate hiatal hernia. IMPRESSION: No active cardiopulmonary disease. Electronically Signed   By: Placido Sou M.D.   On: 04/30/2022 00:05    ____________________________________________   PROCEDURES  Procedure(s) performed:   Procedures  None  ____________________________________________   INITIAL IMPRESSION / ASSESSMENT AND PLAN / ED COURSE  Pertinent labs & imaging results that were available during my care of the patient were reviewed by me and considered in my medical decision making (see chart for details).   This patient is Presenting for Evaluation of cough/congestion, which does require a range of treatment options, and is a complaint that involves a moderate risk of morbidity and mortality.  The Differential Diagnoses include COVID, Flu, RSV, CAP, sinusitis, etc.    Clinical Laboratory Tests Ordered, included viral panel and strep PCR negative.  Radiologic Tests Ordered, included CXR. I independently interpreted the images and agree with radiology  interpretation.   Social Determinants of Health Risk patient is a non-smoker.   Medical Decision Making: Summary:  Patient presents emergency department with cough, congestion, sinus pain/pressure for the past 7 days.  Chest x-ray without evidence of pneumonia.  Viral panel negative.  Strep negative.  Plan to treat bronchitis symptoms and have close PCP follow-up.   Patient's presentation is most consistent with acute, uncomplicated illness.   Disposition: discharge  ____________________________________________  FINAL CLINICAL IMPRESSION(S) / ED DIAGNOSES  Final diagnoses:  Viral URI with cough     NEW OUTPATIENT MEDICATIONS STARTED DURING THIS VISIT:  Discharge Medication List as of 04/30/2022 12:55 AM     START taking these medications   Details  azithromycin (ZITHROMAX) 250 MG tablet Take 1 tablet (250 mg total) by mouth daily. Take first 2 tablets together, then 1 every day until finished., Starting Thu 04/30/2022, Normal    benzonatate (TESSALON) 100 MG capsule Take 1 capsule (100 mg total) by mouth every 8 (eight) hours., Starting Thu 04/30/2022, Normal    fluticasone (FLONASE) 50 MCG/ACT nasal spray Place 2 sprays into both nostrils daily for 14 days., Starting Thu 04/30/2022, Until Thu 05/14/2022, Normal        Note:  This document was prepared using Dragon voice recognition software and may include unintentional dictation errors.  Nanda Quinton, MD, [facility]    Wendelyn Kiesling, [facility], MD 04/30/22 0330

## 2022-04-30 LAB — GROUP A STREP BY PCR: Group A Strep by PCR: NOT DETECTED

## 2022-04-30 MED ORDER — BENZONATATE 100 MG PO CAPS: 100.0000 mg | ORAL_CAPSULE | Freq: Three times a day (TID) | ORAL | 0 refills | Status: DC

## 2022-04-30 MED ORDER — FLUTICASONE PROPIONATE 50 MCG/ACT NA SUSP: 2.0000 | Freq: Every day | NASAL | 0 refills | Status: DC

## 2022-04-30 MED ORDER — AZITHROMYCIN 250 MG PO TABS: 250.0000 mg | ORAL_TABLET | Freq: Every day | ORAL | 0 refills | Status: DC

## 2022-04-30 NOTE — Discharge Instructions (Signed)
Follow-up primary care physician.  Return with any new or suddenly worsening symptoms.

## 2022-04-30 NOTE — ED Notes (Signed)
All appropriate discharge materials reviewed at length with patient. Time for questions provided. Pt has no other questions at this time and verbalizes understanding of all provided materials.  

## 2022-05-07 ENCOUNTER — Telehealth: Payer: Self-pay | Admitting: Nurse Practitioner

## 2022-05-07 MED ORDER — AMOXICILLIN-POT CLAVULANATE 875-125 MG PO TABS: 1.0000 | ORAL_TABLET | Freq: Two times a day (BID) | ORAL | 0 refills | Status: DC

## 2022-05-07 NOTE — Telephone Encounter (Signed)
Patient notified of message and she feels that ear is stopped up and requesting to come in for a recheck.

## 2022-05-07 NOTE — Telephone Encounter (Signed)
Pt said her right ear is still hurting. Pleas call the pt

## 2022-05-08 ENCOUNTER — Ambulatory Visit (INDEPENDENT_AMBULATORY_CARE_PROVIDER_SITE_OTHER): Payer: Medicare Other | Admitting: Nurse Practitioner

## 2022-05-08 ENCOUNTER — Encounter: Payer: Self-pay | Admitting: Nurse Practitioner

## 2022-05-08 VITALS — BP 130/88 | HR 71 | Temp 98.2°F | Ht 62.25 in | Wt 148.0 lb

## 2022-05-08 DIAGNOSIS — H6691 Otitis media, unspecified, right ear: Secondary | ICD-10-CM

## 2022-05-08 MED ORDER — NEOMYCIN-POLYMYXIN-HC 3.5-10000-1 OT SOLN: 3.0000 [drp] | Freq: Three times a day (TID) | OTIC | 0 refills | Status: DC

## 2022-05-08 NOTE — Patient Instructions (Addendum)
It was great to see you!  Start cortisporin ear drops in the your right ear 3 times a day for 5 days.   You can keep taking the flonase and aller-C.   Let's follow-up if your symptoms worsen or don't improve.   Take care,  Vance Peper, NP

## 2022-05-08 NOTE — Progress Notes (Unsigned)
   Acute Office Visit  Subjective:     Patient ID: Jade Williams, female    DOB: 05-07-1950, 72 y.o.   MRN: [id number]  Chief Complaint  Patient presents with   Ear Fullness    In both ears and has sinus drainage    HPI Patient is in today for right ear congestion and fullness. This is associated with post nasal drip. She denies sore throat and fevers. She has been taking vitamin E and aller-C.   ROS See pertinent positives and negatives per HPI.     Objective:    BP 130/88 (BP Location: Right Arm)   Pulse 71   Temp 98.2 F (36.8 C)   Ht 5' 2.25" (1.581 m)   Wt 148 lb (67.1 kg)   SpO2 98%   BMI 26.85 kg/m    Physical Exam Vitals and nursing note reviewed.  Constitutional:      General: She is not in acute distress.    Appearance: Normal appearance.  HENT:     Head: Normocephalic.     Right Ear: External ear normal. Tympanic membrane is erythematous.     Left Ear: Tympanic membrane, ear canal and external ear normal.  Eyes:     Conjunctiva/sclera: Conjunctivae normal.  Cardiovascular:     Rate and Rhythm: Normal rate and regular rhythm.     Pulses: Normal pulses.     Heart sounds: Normal heart sounds.  Pulmonary:     Effort: Pulmonary effort is normal.     Breath sounds: Normal breath sounds.  Musculoskeletal:     Cervical back: Normal range of motion and neck supple. No tenderness.  Lymphadenopathy:     Cervical: No cervical adenopathy.  Skin:    General: Skin is warm.  Neurological:     General: No focal deficit present.     Mental Status: She is alert and oriented to person, place, and time.  Psychiatric:        Mood and Affect: Mood normal.        Behavior: Behavior normal.        Thought Content: Thought content normal.        Judgment: Judgment normal.      Assessment & Plan:   Problem List Items Addressed This Visit   None Visit Diagnoses     Otitis of right ear    -  Primary   Slight erythema to right TM. Will treat with cortisporin  otic x5 days. Continue OTC meds including flonase and aller-c. F/U if not improving.       Meds ordered this encounter  Medications   neomycin-polymyxin-hydrocortisone (CORTISPORIN) OTIC solution    Sig: Place 3 drops into the right ear 3 (three) times daily.    Dispense:  10 mL    Refill:  0    Return if symptoms worsen or fail to improve.  Charyl Dancer, NP

## 2022-05-10 ENCOUNTER — Encounter: Payer: Self-pay | Admitting: Nurse Practitioner

## 2022-05-18 ENCOUNTER — Other Ambulatory Visit: Payer: Self-pay | Admitting: Nurse Practitioner

## 2022-05-28 ENCOUNTER — Telehealth: Payer: Self-pay | Admitting: Nurse Practitioner

## 2022-05-28 DIAGNOSIS — E785 Hyperlipidemia, unspecified: Secondary | ICD-10-CM

## 2022-05-28 NOTE — Telephone Encounter (Signed)
Pt is wanting to schedule a lab visit only to get labs for her cardiologist. I offered an appt, she wanted a telephone request put in first. Pt at [phone] [facility])

## 2022-05-28 NOTE — Telephone Encounter (Signed)
I spoke with patient and let her know that Leotis Shames would be happy to order labs for her just wanted to know which labs to order. Per patient she said just order labs that she would normally order for her.

## 2022-05-28 NOTE — Telephone Encounter (Signed)
I spoke with patient and she was unsure which labs need to be drawn other then getting her cholesterol levels checked.

## 2022-05-28 NOTE — Addendum Note (Signed)
Addended by: Rodman Pickle A on: 05/28/2022 03:44 PM   Modules accepted: Orders

## 2022-06-01 ENCOUNTER — Other Ambulatory Visit: Payer: Medicare Other

## 2022-06-02 ENCOUNTER — Ambulatory Visit (HOSPITAL_BASED_OUTPATIENT_CLINIC_OR_DEPARTMENT_OTHER): Payer: Medicare Other | Admitting: Cardiology

## 2022-06-04 ENCOUNTER — Ambulatory Visit: Payer: Medicare Other | Admitting: Nurse Practitioner

## 2022-06-08 ENCOUNTER — Other Ambulatory Visit: Payer: Medicare Other

## 2022-06-08 ENCOUNTER — Encounter: Payer: Self-pay | Admitting: Nurse Practitioner

## 2022-06-08 ENCOUNTER — Ambulatory Visit (INDEPENDENT_AMBULATORY_CARE_PROVIDER_SITE_OTHER): Payer: Medicare Other | Admitting: Nurse Practitioner

## 2022-06-08 VITALS — BP 136/88 | HR 74 | Temp 97.2°F | Ht 62.25 in | Wt 148.2 lb

## 2022-06-08 DIAGNOSIS — J40 Bronchitis, not specified as acute or chronic: Secondary | ICD-10-CM | POA: Diagnosis not present

## 2022-06-08 MED ORDER — AZITHROMYCIN 250 MG PO TABS: ORAL_TABLET | ORAL | 0 refills | Status: AC

## 2022-06-08 NOTE — Progress Notes (Signed)
Acute Office Visit  Subjective:     Patient ID: Jade Williams, female    DOB: 21-Aug-1950, 72 y.o.   MRN: [id number]  Chief Complaint  Patient presents with   Cough    Head and chest congestion, sinus drainage, scratchy throat, right ear stopped up    HPI: Patient is in today for scratchy throat, head and chest congestion since Thursday.   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     Objective:    BP 136/88 (BP Location: Left Arm)   Pulse 74   Temp (!) 97.2 F (36.2 C)   Ht 5' 2.25" (1.581 m)   Wt 148 lb 3.2 oz (67.2 kg)   SpO2 98%   BMI 26.89 kg/m    Physical Exam Vitals and nursing note reviewed.  Constitutional:      General: She is not in acute distress.    Appearance: Normal appearance.  HENT:     Head: Normocephalic.     Right Ear: Tympanic membrane, ear canal and external ear normal.     Left Ear: Tympanic membrane, ear canal and external ear normal.     Mouth/Throat:     Pharynx: Oropharynx is clear. Posterior oropharyngeal erythema present. No oropharyngeal exudate.  Eyes:     Conjunctiva/sclera: Conjunctivae normal.  Cardiovascular:     Rate and Rhythm: Normal rate and regular rhythm.     Pulses: Normal pulses.     Heart sounds: Normal heart sounds.   Pulmonary:     Effort: Pulmonary effort is normal.     Breath sounds: Normal breath sounds.  Musculoskeletal:     Cervical back: Normal range of motion and neck supple. No tenderness.  Lymphadenopathy:     Cervical: No cervical adenopathy.  Skin:    General: Skin is warm.  Neurological:     General: No focal deficit present.     Mental Status: She is alert and oriented to person, place, and time.  Psychiatric:        Mood and Affect: Mood normal.        Behavior: Behavior normal.        Thought Content: Thought content normal.        Judgment: Judgment normal.     No results found for any visits on 06/08/22.      Assessment & Plan:   Problem List Items Addressed This Visit   None Visit Diagnoses     Bronchitis    -  Primary   Will treat with z-pak. Keep taking OTC meds and start flonase and loratadine daily. Encourage fluids, rest. F/U if not improving.       Meds ordered this encounter  Medications   azithromycin (ZITHROMAX) 250 MG tablet    Sig: Take 2 tablets on day 1, then 1 tablet daily on days 2 through 5    Dispense:  6 tablet    Refill:  0    Return if symptoms worsen or fail to improve.  Gerre Scull, NP

## 2022-06-08 NOTE — Patient Instructions (Signed)
It was great to see you!  Start azithromycin 2 tablets today, then 1 tablet daily for 4 days.   Start flonase and loratadine daily to help with your ear.   Keep taking the vitamin c and honey and drinking fluids  Let's follow-up if your symptoms worsen or don't improve.   Take care,  Rodman Pickle, NP

## 2022-06-11 ENCOUNTER — Other Ambulatory Visit (INDEPENDENT_AMBULATORY_CARE_PROVIDER_SITE_OTHER): Payer: Medicare Other

## 2022-06-11 DIAGNOSIS — E785 Hyperlipidemia, unspecified: Secondary | ICD-10-CM

## 2022-06-11 LAB — LIPID PANEL
Cholesterol: 182 mg/dL (ref 0–200)
HDL: 56.2 mg/dL (ref >39.00)
LDL Cholesterol: 98 mg/dL (ref 0–99)
NonHDL: 126.08
Total CHOL/HDL Ratio: 3
Triglycerides: 138 mg/dL (ref 0.0–149.0)
VLDL: 27.6 mg/dL (ref 0.0–40.0)

## 2022-06-11 LAB — COMPREHENSIVE METABOLIC PANEL
ALT: 21 U/L (ref 0–35)
AST: 26 U/L (ref 0–37)
Albumin: 4.1 g/dL (ref 3.5–5.2)
Alkaline Phosphatase: 128 U/L — ABNORMAL HIGH (ref 39–117)
BUN: 19 mg/dL (ref 6–23)
CO2: 30 mEq/L (ref 19–32)
Calcium: 9.1 mg/dL (ref 8.4–10.5)
Chloride: 102 mEq/L (ref 96–112)
Creatinine, Ser: 0.83 mg/dL (ref 0.40–1.20)
GFR: 69.84 mL/min (ref >60.00)
Glucose, Bld: 94 mg/dL (ref 70–99)
Potassium: 4.7 mEq/L (ref 3.5–5.1)
Sodium: 140 mEq/L (ref 135–145)
Total Bilirubin: 0.5 mg/dL (ref 0.2–1.2)
Total Protein: 6.7 g/dL (ref 6.0–8.3)

## 2022-06-11 LAB — CBC
HCT: 43.3 % (ref 36.0–46.0)
Hemoglobin: 14.2 g/dL (ref 12.0–15.0)
MCHC: 32.7 g/dL (ref 30.0–36.0)
MCV: 87.4 fl (ref 78.0–100.0)
Platelets: 216 10*3/uL (ref 150.0–400.0)
RBC: 4.95 Mil/uL (ref 3.87–5.11)
RDW: 14.6 % (ref 11.5–15.5)
WBC: 5.8 10*3/uL (ref 4.0–10.5)

## 2022-06-19 ENCOUNTER — Encounter (HOSPITAL_BASED_OUTPATIENT_CLINIC_OR_DEPARTMENT_OTHER): Payer: Self-pay | Admitting: Cardiology

## 2022-06-19 ENCOUNTER — Ambulatory Visit (HOSPITAL_BASED_OUTPATIENT_CLINIC_OR_DEPARTMENT_OTHER): Payer: Medicare Other | Admitting: Cardiology

## 2022-06-19 VITALS — BP 132/72 | HR 66 | Ht 62.0 in | Wt 149.3 lb

## 2022-06-19 DIAGNOSIS — I10 Essential (primary) hypertension: Secondary | ICD-10-CM

## 2022-06-19 DIAGNOSIS — Z7189 Other specified counseling: Secondary | ICD-10-CM | POA: Diagnosis not present

## 2022-06-19 DIAGNOSIS — I700 Atherosclerosis of aorta: Secondary | ICD-10-CM | POA: Diagnosis not present

## 2022-06-19 DIAGNOSIS — E7800 Pure hypercholesterolemia, unspecified: Secondary | ICD-10-CM | POA: Diagnosis not present

## 2022-06-19 NOTE — Progress Notes (Signed)
Cardiology Office Note:    Date:  06/19/2022   ID:  Jade Williams, DOB 02/20/1950, MRN [id number]  PCP:  Gerre Scull, NP  Cardiologist:  None  Referring MD: Gerre Scull, NP   CC: establish care with me/prior patient of Dr. Katrinka Blazing  History of Present Illness:    Jade Williams is a 72 y.o. female with a hx of hypercholesterolemia, hypertension, family history of CAD, and hiatal hernia.   Previously seen by Dr. Katrinka Blazing, last seen 04/14/21. She was scared to increase her statin, though this was recommended by Dr. Katrinka Blazing.   We reviewed her medications and prior history at length. Nuclear stress 2020 without evidence of ischemia. CT chest 2022 with mild aortic atherosclerosis. No comment on coronary calcifications.  Has hiatal hernia. No chest pain concerning for heart cause. Breathing stable. No syncope or palpitations.  Past Medical History:  Diagnosis Date   Anxiety    Back pain    Barrett's esophagus 01/17/2021   Depression    GERD (gastroesophageal reflux disease)    Hyperlipidemia    Hypertension    Osteopenia 11/14/2019   T score -1.6   Pernicious anemia     Past Surgical History:  Procedure Laterality Date   APPENDECTOMY     HEMORRHOID BANDING     TONSILLECTOMY     WISDOM TOOTH EXTRACTION      Current Medications: Current Outpatient Medications on File Prior to Visit  Medication Sig   ascorbic acid (VITAMIN C) 500 MG tablet Take 500 mg by mouth daily.   atorvastatin (LIPITOR) 10 MG tablet Take 1 tablet (10 mg total) by mouth daily.   b complex vitamins capsule Take 1 capsule by mouth daily.   benzonatate (TESSALON) 100 MG capsule Take 1 capsule (100 mg total) by mouth every 8 (eight) hours.   Bioflavonoid Products (VITAMIN C) CHEW Chew by mouth.   calcium carbonate (OS-CAL - DOSED IN MG OF ELEMENTAL CALCIUM) 1250 (500 Ca) MG tablet Take 2 tablets by mouth daily.   cholecalciferol (VITAMIN D) 1000 UNITS tablet Take 5,000 Units by mouth daily.     Coenzyme Q10 (CO Q 10 PO) Take by mouth.   cyanocobalamin (VITAMIN B12) 1000 MCG/ML injection INJECT 1 ML INTRAMUSCULARLY EVERY 30 DAYS   fluticasone (FLONASE) 50 MCG/ACT nasal spray Place 2 sprays into both nostrils daily for 14 days. (Patient taking differently: Place 2 sprays into both nostrils as needed for allergies or rhinitis.)   loratadine (CLARITIN) 10 MG tablet Take 10 mg by mouth as needed.   MAGNESIUM CARBONATE PO Take by mouth.   OVER THE COUNTER MEDICATION Trace Minerals Daily   sertraline (ZOLOFT) 100 MG tablet TAKE 1 TABLET BY MOUTH EVERY DAY   valsartan (DIOVAN) 160 MG tablet TAKE 1 TABLET BY MOUTH EVERY DAY   vitamin E 400 UNIT capsule Take 400 Units by mouth daily.   Zinc Sulfate (ZINC 15 PO) Take by mouth.   No current facility-administered medications on file prior to visit.     Allergies:   Sulfa antibiotics   Social History   Tobacco Use   Smoking status: Never   Smokeless tobacco: Never  Vaping Use   Vaping Use: Never used  Substance Use Topics   Alcohol use: Not Currently    Comment: ocassional wine   Drug use: No    Family History: family history includes Cancer in her father and sister; Heart disease in her mother; Hyperlipidemia in her mother; Hypertension in her mother; Macular degeneration in  her mother; Stroke in her mother. There is no history of Colon cancer, Colon polyps, Esophageal cancer, Rectal cancer, or Stomach cancer.  ROS:   Please see the history of present illness.  Additional pertinent ROS otherwise unremarkable.   EKGs/Labs/Other Studies Reviewed:    EKG:  EKG is personally reviewed.   06/19/22: NSR at 66 bpm  Recent Labs: 06/11/2022: ALT 21; BUN 19; Creatinine, Ser 0.83; Hemoglobin 14.2; Platelets 216.0; Potassium 4.7; Sodium 140  Recent Lipid Panel    Component Value Date/Time   CHOL 182 06/11/2022 0828   TRIG 138.0 06/11/2022 0828   HDL 56.20 06/11/2022 0828   CHOLHDL 3 06/11/2022 0828   VLDL 27.6 06/11/2022 0828   LDLCALC  98 06/11/2022 0828   LDLDIRECT 184.0 02/11/2022 0855    Physical Exam:    VS:  BP 132/72 (BP Location: Left Arm, Patient Position: Sitting, Cuff Size: Normal)   Pulse 66   Ht 5\' 2"  (1.575 m)   Wt 149 lb 4.8 oz (67.7 kg)   BMI 27.31 kg/m     Wt Readings from Last 3 Encounters:  06/19/22 149 lb 4.8 oz (67.7 kg)  06/08/22 148 lb 3.2 oz (67.2 kg)  05/08/22 148 lb (67.1 kg)    GEN: Well nourished, well developed in no acute distress HEENT: Normal, moist mucous membranes NECK: No JVD CARDIAC: regular rhythm, normal S1 and S2, no rubs or gallops. No murmur. VASCULAR: Radial and DP pulses 2+ bilaterally. No carotid bruits RESPIRATORY:  Clear to auscultation without rales, wheezing or rhonchi  ABDOMEN: Soft, non-tender, non-distended MUSCULOSKELETAL:  Ambulates independently SKIN: Warm and dry, no edema NEUROLOGIC:  Alert and oriented x 3. No focal neuro deficits noted. PSYCHIATRIC:  Normal affect    ASSESSMENT:    1. Primary hypertension   2. Aortic atherosclerosis (HCC)   3. Pure hypercholesterolemia   4. Cardiac risk counseling   5. Counseling on health promotion and disease prevention    PLAN:    Hypertension -continue valsartan  Aortic atherosclerosis Hypercholesterolemia -discussed goals/guidelines. She was previously recommended to increase statin but is nervous about this, previously did not tolerate higher doses. After shared decision making, reasonable to continue current dose of atorvastatin, last LDL 98. Discussed lifestyle changes. Will re-discuss in 3 mos  Cardiac risk counseling and prevention recommendations: -recommend heart healthy/Mediterranean diet, with whole grains, fruits, vegetable, fish, lean meats, nuts, and olive oil. Limit salt. -recommend moderate walking, 3-5 times/week for 30-50 minutes each session. Aim for at least 150 minutes.week. Goal should be pace of 3 miles/hours, or walking 1.5 miles in 30 minutes -recommend avoidance of tobacco  products. Avoid excess alcohol. -ASCVD risk score: The 10-year ASCVD risk score (Arnett DK, et al., 2019) is: 44.4%   Values used to calculate the score:     Age: 77 years     Sex: Female     Is Non-Hispanic African American: No     Diabetic: Yes     Tobacco smoker: Yes     Systolic Blood Pressure: 132 mmHg     Is BP treated: Yes     HDL Cholesterol: 56.2 mg/dL     Total Cholesterol: 182 mg/dL    Plan for follow up: 3 mos or sooner as needed  Jodelle Red, MD, PhD, [facility]  [facility]  [place]  Heart & Vascular at [facility] at [facility] [address] [place], [place] [address] [phone]   Signed, Jodelle Red, MD PhD 06/19/2022  [facility]

## 2022-06-19 NOTE — Patient Instructions (Signed)
Medication Instructions:  The current medical regimen is effective;  continue present plan and medications.   *If you need a refill on your cardiac medications before your next appointment, please call your pharmacy*   Lab Work: None   Testing/Procedures: None   Follow-Up: At [facility], you and your health needs are our priority.  As part of our continuing mission to provide you with exceptional heart care, we have created designated Provider Care Teams.  These Care Teams include your primary Cardiologist (physician) and Advanced Practice Providers (APPs -  Physician Assistants and Nurse Practitioners) who all work together to provide you with the care you need, when you need it.  We recommend signing up for the patient portal called "MyChart".  Sign up information is provided on this After Visit Summary.  MyChart is used to connect with patients for Virtual Visits (Telemedicine).  Patients are able to view lab/test results, encounter notes, upcoming appointments, etc.  Non-urgent messages can be sent to your provider as well.   To learn more about what you can do with MyChart, go to [email or web address].    Your next appointment:   3 month(s)  Provider:   Bridgette Christopher, MD    Other Instructions None  

## 2022-08-07 ENCOUNTER — Encounter (HOSPITAL_BASED_OUTPATIENT_CLINIC_OR_DEPARTMENT_OTHER): Payer: Self-pay | Admitting: Cardiology

## 2022-08-18 ENCOUNTER — Other Ambulatory Visit: Payer: Self-pay | Admitting: Nurse Practitioner

## 2022-08-18 DIAGNOSIS — H53143 Visual discomfort, bilateral: Secondary | ICD-10-CM | POA: Diagnosis not present

## 2022-08-18 DIAGNOSIS — H524 Presbyopia: Secondary | ICD-10-CM | POA: Diagnosis not present

## 2022-08-18 DIAGNOSIS — H5211 Myopia, right eye: Secondary | ICD-10-CM | POA: Diagnosis not present

## 2022-08-18 DIAGNOSIS — H52222 Regular astigmatism, left eye: Secondary | ICD-10-CM | POA: Diagnosis not present

## 2022-09-15 DIAGNOSIS — H3530 Unspecified macular degeneration: Secondary | ICD-10-CM | POA: Diagnosis not present

## 2022-09-15 DIAGNOSIS — H353131 Nonexudative age-related macular degeneration, bilateral, early dry stage: Secondary | ICD-10-CM | POA: Diagnosis not present

## 2022-09-15 DIAGNOSIS — H524 Presbyopia: Secondary | ICD-10-CM | POA: Diagnosis not present

## 2022-09-29 ENCOUNTER — Ambulatory Visit (HOSPITAL_BASED_OUTPATIENT_CLINIC_OR_DEPARTMENT_OTHER): Payer: Medicare Other | Admitting: Cardiology

## 2022-09-29 ENCOUNTER — Encounter (HOSPITAL_BASED_OUTPATIENT_CLINIC_OR_DEPARTMENT_OTHER): Payer: Self-pay | Admitting: Cardiology

## 2022-09-29 ENCOUNTER — Other Ambulatory Visit: Payer: Self-pay

## 2022-09-29 VITALS — BP 136/88 | HR 64 | Ht 62.0 in | Wt 151.0 lb

## 2022-09-29 DIAGNOSIS — Z7189 Other specified counseling: Secondary | ICD-10-CM

## 2022-09-29 DIAGNOSIS — I10 Essential (primary) hypertension: Secondary | ICD-10-CM | POA: Diagnosis not present

## 2022-09-29 DIAGNOSIS — E7800 Pure hypercholesterolemia, unspecified: Secondary | ICD-10-CM | POA: Diagnosis not present

## 2022-09-29 DIAGNOSIS — I700 Atherosclerosis of aorta: Secondary | ICD-10-CM

## 2022-09-29 NOTE — Patient Instructions (Addendum)
We talked about the history and discovery of PCSK9 today. It's a great story if you ever want to learn more.  Here's a link to more information about the microbiome:  EscrowEtc.es   Medication Instructions:  Continue same medications *If you need a refill on your cardiac medications before your next appointment, please call your pharmacy*   Lab Work: Have a Lipid Panel done in 6 months 1 week before appointment with Dr.Christopher   Testing/Procedures: None ordered   Follow-Up: At [facility], you and your health needs are our priority.  As part of our continuing mission to provide you with exceptional heart care, we have created designated Provider Care Teams.  These Care Teams include your primary Cardiologist (physician) and Advanced Practice Providers (APPs -  Physician Assistants and Nurse Practitioners) who all work together to provide you with the care you need, when you need it.  We recommend signing up for the patient portal called "MyChart".  Sign up information is provided on this After Visit Summary.  MyChart is used to connect with patients for Virtual Visits (Telemedicine).  Patients are able to view lab/test results, encounter notes, upcoming appointments, etc.  Non-urgent messages can be sent to your provider as well.   To learn more about what you can do with MyChart, go to [email or web address].    Your next appointment:  6 months    Provider:  Dr.Christopher

## 2022-09-29 NOTE — Progress Notes (Signed)
Cardiology Office Note:  .   Date:  09/29/2022  ID:  Jade Williams, DOB 1950/11/02, MRN [id number] PCP: Gerre Scull, NP  [facility] Providers Cardiologist:  Jodelle Red, MD {  History of Present Illness: Jade Williams is a 72 y.o. female with a hx of hypercholesterolemia, hypertension, family history of CAD, and hiatal hernia.    Previously seen by Dr. Katrinka Blazing, last seen 04/14/21. She was scared to increase her statin, though this was recommended by Dr. Katrinka Blazing.    CV history: Nuclear stress 2020 without evidence of ischemia. CT chest 2022 with mild aortic atherosclerosis. No comment on coronary calcifications.  Today: Working on her cholesterol with supplements. Started taking alpha omega DHA by [employer] about a week ago. 700 mg EPA, 700 mg DHA, 1600 mg total omega 3. Tolerating well.   Spent extensive time today discussing current state of research on genetics, microbiome, testing, etc.  ROS: Denies chest pain, shortness of breath at rest or with normal exertion. No PND, orthopnea, LE edema or unexpected weight gain. No syncope or palpitations. ROS otherwise negative except as noted.   Studies Reviewed: Marland Kitchen    EKG:     not ordered today  Physical Exam:   VS:  BP (!) 143/81 (BP Location: Left Arm, Patient Position: Sitting, Cuff Size: Large)   Pulse 64   Ht 5\' 2"  (1.575 m)   Wt 151 lb (68.5 kg)   SpO2 97%   BMI 27.62 kg/m    Wt Readings from Last 3 Encounters:  09/29/22 151 lb (68.5 kg)  06/19/22 149 lb 4.8 oz (67.7 kg)  06/08/22 148 lb 3.2 oz (67.2 kg)    GEN: Well nourished, well developed in no acute distress HEENT: Normal, moist mucous membranes NECK: No JVD CARDIAC: regular rhythm, normal S1 and S2, no rubs or gallops. No murmur. VASCULAR: Radial and DP pulses 2+ bilaterally. No carotid bruits RESPIRATORY:  Clear to auscultation without rales, wheezing or rhonchi  ABDOMEN: Soft, non-tender, non-distended MUSCULOSKELETAL:   Ambulates independently SKIN: Warm and dry, no edema NEUROLOGIC:  Alert and oriented x 3. No focal neuro deficits noted. PSYCHIATRIC:  Normal affect    ASSESSMENT AND PLAN: .   Hypertension -continue valsartan   Aortic atherosclerosis Hypercholesterolemia -trying to manage with supplements. She wants to avoid increasing statins if at all possible. We have talked about guidelines, risks/benefits.  -discussed science, available guidelines -last ldl 98, goal ldl <70. Recheck prior to next visit -she is working on diet changes  Discussed research, genetic testing, microbiome at length today.  CV risk counseling and prevention -recommend heart healthy/Mediterranean diet, with whole grains, fruits, vegetable, fish, lean meats, nuts, and olive oil. Limit salt. -recommend moderate walking, 3-5 times/week for 30-50 minutes each session. Aim for at least 150 minutes.week. Goal should be pace of 3 miles/hours, or walking 1.5 miles in 30 minutes -recommend avoidance of tobacco products. Avoid excess alcohol. -ASCVD risk score: The 10-year ASCVD risk score (Arnett DK, et al., 2019) is: 49.8%   Values used to calculate the score:     Age: 47 years     Sex: Female     Is Non-Hispanic African American: No     Diabetic: Yes     Tobacco smoker: Yes     Systolic Blood Pressure: 143 mmHg     Is BP treated: Yes     HDL Cholesterol: 56.2 mg/dL     Total Cholesterol: 182 mg/dL    Dispo:  6 mos, with lipids prior  Signed, Jodelle Red, MD   Jodelle Red, MD, PhD, [facility]  [facility]  [place]  Heart & Vascular at [facility] at [facility] [address] [place], [place] [address] [phone]

## 2022-10-14 LAB — HEMOGLOBIN A1C: Hemoglobin A1C: 5.8

## 2022-11-02 ENCOUNTER — Encounter: Payer: Self-pay | Admitting: Nurse Practitioner

## 2022-11-06 ENCOUNTER — Other Ambulatory Visit: Payer: Self-pay | Admitting: Nurse Practitioner

## 2022-11-06 NOTE — Telephone Encounter (Signed)
Requesting: VALSARTAN 160 MG TABLET  Last Visit: 06/08/2022 Next Visit: Visit date not found Last Refill: 11/26/2021  Please Advise

## 2022-11-13 ENCOUNTER — Encounter: Payer: Self-pay | Admitting: Nurse Practitioner

## 2022-11-13 ENCOUNTER — Other Ambulatory Visit: Payer: Self-pay | Admitting: Nurse Practitioner

## 2022-11-13 ENCOUNTER — Ambulatory Visit (INDEPENDENT_AMBULATORY_CARE_PROVIDER_SITE_OTHER): Payer: Medicare Other | Admitting: Nurse Practitioner

## 2022-11-13 VITALS — BP 110/70 | HR 74 | Temp 97.3°F | Ht 62.0 in | Wt 152.2 lb

## 2022-11-13 DIAGNOSIS — R102 Pelvic and perineal pain: Secondary | ICD-10-CM

## 2022-11-13 DIAGNOSIS — N3000 Acute cystitis without hematuria: Secondary | ICD-10-CM

## 2022-11-13 LAB — POC URINALSYSI DIPSTICK (AUTOMATED)
Bilirubin, UA: NEGATIVE
Blood, UA: NEGATIVE
Glucose, UA: NEGATIVE
Ketones, UA: NEGATIVE
Nitrite, UA: POSITIVE
Protein, UA: NEGATIVE
Spec Grav, UA: 1.015 (ref 1.010–1.025)
Urobilinogen, UA: 0.2 U/dL
pH, UA: 5.5 (ref 5.0–8.0)

## 2022-11-13 MED ORDER — FLUCONAZOLE 150 MG PO TABS: 150.0000 mg | ORAL_TABLET | Freq: Once | ORAL | 0 refills | Status: AC

## 2022-11-13 MED ORDER — CIPROFLOXACIN HCL 250 MG PO TABS: 250.0000 mg | ORAL_TABLET | Freq: Two times a day (BID) | ORAL | 0 refills | Status: AC

## 2022-11-13 NOTE — Patient Instructions (Signed)
It was great to see you!  Start cipro twice a day for 7 days. Drink plenty of water.   You can take a diflucan after finishing the antibiotic if needed for ongoing vaginal itching.   Let's follow-up in 6 months, sooner if you have concerns.  If a referral was placed today, you will be contacted for an appointment. Please note that routine referrals can sometimes take up to 3-4 weeks to process. Please call our office if you haven't heard anything after this time frame.  Take care,  Rodman Pickle, NP

## 2022-11-13 NOTE — Progress Notes (Signed)
Acute Office Visit  Subjective:     Patient ID: Jade Williams, female    DOB: [birth year], 72 y.o.   MRN: [id number]  Chief Complaint  Patient presents with   Pelvic Pressure    Pelvic pressure and itching, no pain with urination    HPI Patient is in today for pelvic pressure and some itching for 2 days.  URINARY SYMPTOMS  Dysuria: no Urinary frequency: yes Urgency: yes Small volume voids: yes Symptom severity:  mild Urinary incontinence: no Foul odor: no Hematuria: no Abdominal pain: no Back pain: no Suprapubic pain/pressure: yes Flank pain: no Fever:  no Vomiting: no Relief with cranberry juice: no Relief with pyridium: no Status: stable Previous urinary tract infection: yes Recurrent urinary tract infection: no Treatments attempted: pyridium, azo gummies  ROS See pertinent positives and negatives per HPI.     Objective:    BP 110/70 (BP Location: Right Arm)   Pulse 74   Temp (!) 97.3 F (36.3 C)   Ht 5\' 2"  (1.575 m)   Wt 152 lb 3.2 oz (69 kg)   SpO2 97%   BMI 27.84 kg/m     Physical Exam Vitals and nursing note reviewed.  Constitutional:      General: She is not in acute distress.    Appearance: Normal appearance.  HENT:     Head: Normocephalic.  Eyes:     Conjunctiva/sclera: Conjunctivae normal.  Pulmonary:     Effort: Pulmonary effort is normal.  Abdominal:     Palpations: Abdomen is soft.     Tenderness: There is no abdominal tenderness. There is no right CVA tenderness or left CVA tenderness.  Musculoskeletal:     Cervical back: Normal range of motion.  Skin:    General: Skin is warm.  Neurological:     General: No focal deficit present.     Mental Status: She is alert and oriented to person, place, and time.  Psychiatric:        Mood and Affect: Mood normal.        Behavior: Behavior normal.        Thought Content: Thought content normal.        Judgment: Judgment normal.     Results for orders placed or performed in  visit on 11/13/22  POCT Urinalysis Dipstick (Automated)  Result Value Ref Range   Color, UA     Clarity, UA     Glucose, UA Negative Negative   Bilirubin, UA Negative    Ketones, UA Negative    Spec Grav, UA 1.015 1.010 - 1.025   Blood, UA Negative    pH, UA 5.5 5.0 - 8.0   Protein, UA Negative Negative   Urobilinogen, UA 0.2 0.2 or 1.0 E.U./dL   Nitrite, UA Positive    Leukocytes, UA Small (1+) (A) Negative        Assessment & Plan:   Problem List Items Addressed This Visit   None Visit Diagnoses     Acute cystitis without hematuria    -  Primary   Start cipro BID x 7 days. Encourage fluids. Diflucan given prn yeast infection after antibiotics. F/U if not improving.   Relevant Orders   Urine Culture   Pelvic pressure in female       U/A showed 1+ leukocytes, will treat for UTI and add on urine culture.   Relevant Orders   POCT Urinalysis Dipstick (Automated) (Completed)   Urine Culture       Meds ordered this  encounter  Medications   ciprofloxacin (CIPRO) 250 MG tablet    Sig: Take 1 tablet (250 mg total) by mouth 2 (two) times daily for 7 days.    Dispense:  14 tablet    Refill:  0   fluconazole (DIFLUCAN) 150 MG tablet    Sig: Take 1 tablet (150 mg total) by mouth once for 1 dose. Take after finishing antibiotic    Dispense:  1 tablet    Refill:  0    Return in about 6 months (around 05/13/2023) for CPE.  Gerre Scull, NP

## 2022-11-14 LAB — URINE CULTURE
MICRO NUMBER:: 15525158
SPECIMEN QUALITY:: ADEQUATE

## 2022-11-27 ENCOUNTER — Ambulatory Visit: Payer: Medicare Other

## 2022-11-27 ENCOUNTER — Other Ambulatory Visit: Payer: Self-pay | Admitting: Nurse Practitioner

## 2022-11-27 DIAGNOSIS — N3946 Mixed incontinence: Secondary | ICD-10-CM

## 2022-11-27 DIAGNOSIS — Z0000 Encounter for general adult medical examination without abnormal findings: Secondary | ICD-10-CM | POA: Diagnosis not present

## 2022-11-27 MED ORDER — CIPROFLOXACIN HCL 500 MG PO TABS: 500.0000 mg | ORAL_TABLET | Freq: Two times a day (BID) | ORAL | 0 refills | Status: AC

## 2022-11-27 NOTE — Progress Notes (Signed)
Subjective:   Jade Williams is a 72 y.o. female who presents for Medicare Annual (Subsequent) preventive examination.  Visit Complete: Virtual I connected with  Jade Williams on 11/27/22 by a audio enabled telemedicine application and verified that I am speaking with the correct person using two identifiers.  Patient Location: Home  Provider Location: Office/Clinic  I discussed the limitations of evaluation and management by telemedicine. The patient expressed understanding and agreed to proceed.  Vital Signs: Because this visit was a virtual/telehealth visit, some criteria may be missing or patient reported. Any vitals not documented were not able to be obtained and vitals that have been documented are patient reported.    Cardiac Risk Factors include: advanced age (>70men, >17 women);dyslipidemia;hypertension     Objective:    Today's Vitals   There is no height or weight on file to calculate BMI.     11/27/2022    3:33 PM 04/29/2022    9:21 PM 01/26/2022    2:30 PM 10/20/2020    7:34 PM 05/09/2017    2:34 PM  Advanced Directives  Does Patient Have a Medical Advance Directive? Yes No Yes No Yes  Type of Estate agent of [place];Living will  Healthcare Power of [place];Living will    Copy of Healthcare Power of Attorney in Chart? No - copy requested  No - copy requested    Would patient like information on creating a medical advance directive?  No - Patient declined  No - Patient declined     Current Medications (verified) Outpatient Encounter Medications as of 11/27/2022  Medication Sig   ascorbic acid (VITAMIN C) 500 MG tablet Take 500 mg by mouth daily.   atorvastatin (LIPITOR) 10 MG tablet TAKE 1 TABLET BY MOUTH EVERY DAY   b complex vitamins capsule Take 1 capsule by mouth daily.   Bioflavonoid Products (VITAMIN C) CHEW Chew by mouth.   calcium carbonate (OS-CAL - DOSED IN MG OF ELEMENTAL CALCIUM) 1250 (500 Ca) MG tablet Take 2  tablets by mouth daily.   cholecalciferol (VITAMIN D) 1000 UNITS tablet Take 5,000 Units by mouth daily.    Coenzyme Q10 (CO Q 10 PO) Take by mouth.   cyanocobalamin (VITAMIN B12) 1000 MCG/ML injection INJECT 1 ML INTRAMUSCULARLY EVERY 30 DAYS   Docosahexaenoic Acid (DHA OMEGA 3 PO) Take 1 capsule by mouth daily.   loratadine (CLARITIN) 10 MG tablet Take 10 mg by mouth as needed.   MAGNESIUM CARBONATE PO Take by mouth.   Multiple Vitamins-Minerals (ICAPS AREDS 2 PO) Take 1 capsule by mouth daily.   OVER THE COUNTER MEDICATION Trace Minerals Daily   sertraline (ZOLOFT) 100 MG tablet TAKE 1 TABLET BY MOUTH EVERY DAY   valsartan (DIOVAN) 160 MG tablet TAKE 1 TABLET BY MOUTH EVERY DAY   vitamin E 400 UNIT capsule Take 400 Units by mouth daily.   No facility-administered encounter medications on file as of 11/27/2022.    Allergies (verified) Sulfa antibiotics   History: Past Medical History:  Diagnosis Date   Anxiety    Back pain    Barrett's esophagus 01/17/2021   Depression    GERD (gastroesophageal reflux disease)    Hyperlipidemia    Hypertension    Osteopenia 11/14/2019   T score -1.6   Pernicious anemia    Past Surgical History:  Procedure Laterality Date   APPENDECTOMY     HEMORRHOID BANDING     TONSILLECTOMY     WISDOM TOOTH EXTRACTION     Family History  Problem Relation Age of Onset   Hyperlipidemia Mother    Hypertension Mother    Stroke Mother    Heart disease Mother    Macular degeneration Mother    Cancer Father        lung cancer   Cancer Sister        lung cancer   Colon cancer Neg Hx    Colon polyps Neg Hx    Esophageal cancer Neg Hx    Rectal cancer Neg Hx    Stomach cancer Neg Hx    Social History   Socioeconomic History   Marital status: Single    Spouse name: Not on file   Number of children: Not on file   Years of education: Not on file   Highest education level: Not on file  Occupational History   Not on file  Tobacco Use   Smoking  status: Never   Smokeless tobacco: Never  Vaping Use   Vaping status: Never Used  Substance and Sexual Activity   Alcohol use: Not Currently   Drug use: No   Sexual activity: Not on file  Other Topics Concern   Not on file  Social History Narrative   Not on file   Social Determinants of Health   Financial Resource Strain: Low Risk  (11/27/2022)   Overall Financial Resource Strain (CARDIA)    Difficulty of Paying Living Expenses: Not hard at all  Food Insecurity: No Food Insecurity (11/27/2022)   Hunger Vital Sign    Worried About Running Out of Food in the Last Year: Never true    Ran Out of Food in the Last Year: Never true  Transportation Needs: No Transportation Needs (11/27/2022)   PRAPARE - Administrator, Civil Service (Medical): No    Lack of Transportation (Non-Medical): No  Physical Activity: Sufficiently Active (11/27/2022)   Exercise Vital Sign    Days of Exercise per Week: 7 days    Minutes of Exercise per Session: 60 min  Stress: No Stress Concern Present (11/27/2022)   Jade Williams of Occupational Health - Occupational Stress Questionnaire    Feeling of Stress : Not at all  Social Connections: Moderately Isolated (11/27/2022)   Social Connection and Isolation Panel [NHANES]    Frequency of Communication with Friends and Family: More than three times a week    Frequency of Social Gatherings with Friends and Family: More than three times a week    Attends Religious Services: Never    Database administrator or Organizations: Yes    Attends Engineer, structural: More than 4 times per year    Marital Status: Never married    Tobacco Counseling Counseling given: Not Answered   Clinical Intake:  Pre-visit preparation completed: Yes  Pain : No/denies pain     Nutritional Risks: None Diabetes: No  How often do you need to have someone help you when you read instructions, pamphlets, or other written materials from your doctor or  pharmacy?: 1 - Never  Interpreter Needed?: No  Information entered by :: NAllen LPN   Activities of Daily Living    11/27/2022    3:26 PM 01/26/2022    2:31 PM  In your present state of health, do you have any difficulty performing the following activities:  Hearing? 0 0  Comment a little bit   Vision? 0 0  Difficulty concentrating or making decisions? 0 0  Walking or climbing stairs? 0 0  Dressing or bathing? 0 0  Doing errands, shopping? 0 0  Preparing Food and eating ? N N  Using the Toilet? N N  In the past six months, have you accidently leaked urine? Y N  Comment would like referral for PT of pelvic floor   Do you have problems with loss of bowel control? N N  Managing your Medications? N N  Managing your Finances? N N  Housekeeping or managing your Housekeeping? N N    Patient Care Team: Gerre Scull, NP as PCP - General (Internal Medicine) Jodelle Red, MD as PCP - Cardiology (Cardiology)  Indicate any recent Medical Services you may have received from other than [facility] providers in the past year (date may be approximate).     Assessment:   This is a routine wellness examination for Saidah.  Hearing/Vision screen Hearing Screening - Comments:: Slight trouble Vision Screening - Comments:: Regular eye exams, [facility]   Goals Addressed             This Visit's Progress    Patient Stated       11/27/2022, lose weight and not eat much sugar; eat more protein       Depression Screen    11/27/2022    3:34 PM 01/26/2022    2:31 PM 04/02/2021    3:12 PM  PHQ 2/9 Scores  PHQ - 2 Score 0 0 0    Fall Risk    11/27/2022    3:34 PM 04/27/2022    1:48 PM 01/26/2022    2:31 PM 01/25/2022    3:44 PM 09/16/2018   10:44 AM  Fall Risk   Falls in the past year? 0 0 0 0 --  Comment     Emmi Telephone Survey: data to providers prior to load  Number falls in past yr: 0 0 0 0 --  Comment     Emmi Telephone Survey Actual Response =    Injury with Fall? 0 0 0 0   Risk for fall due to : Medication side effect No Fall Risks Medication side effect    Follow up Falls prevention discussed;Falls evaluation completed Falls evaluation completed Falls prevention discussed;Education provided;Falls evaluation completed      MEDICARE RISK AT HOME: Medicare Risk at Home Any stairs in or around the home?: No If so, are there any without handrails?: No Home free of loose throw rugs in walkways, pet beds, electrical cords, etc?: Yes Adequate lighting in your home to reduce risk of falls?: Yes Life alert?: No Use of a cane, walker or w/c?: No Grab bars in the bathroom?: No Shower chair or bench in shower?: No Elevated toilet seat or a handicapped toilet?: Yes  TIMED UP AND GO:  Was the test performed?  No    Cognitive Function:        11/27/2022    3:35 PM 01/26/2022    2:31 PM  6CIT Screen  What Year? 0 points 0 points  What month? 0 points 0 points  What time? 0 points 0 points  Count back from 20 0 points 0 points  Months in reverse 0 points 0 points  Repeat phrase 0 points 0 points  Total Score 0 points 0 points    Immunizations Immunization History  Administered Date(s) Administered   Hepatitis A 06/06/1998   Hepatitis A, Ped/Adol-2 Dose 12/06/1998   Influenza, High Dose Seasonal PF 10/17/2022   Influenza-Unspecified 02/16/2018, 11/30/2020, 11/14/2021   PFIZER(Purple Top)SARS-COV-2 Vaccination 03/16/2019, 04/07/2019, 12/05/2019, 06/28/2020   Pfizer Covid-19 Vaccine  Bivalent Booster 54yrs & up 11/14/2020   Pneumococcal Conjugate-13 10/25/2014   Pneumococcal Polysaccharide-23 10/31/2015   RSV,unspecified 01/31/2022   Tdap 01/31/2013   Unspecified SARS-COV-2 Vaccination 01/26/2022   Zoster Recombinant(Shingrix) 11/01/2018, 01/31/2019    TDAP status: Up to date  Flu Vaccine status: Up to date  Pneumococcal vaccine status: Up to date  Covid-19 vaccine status: Information provided on how to obtain  vaccines.   Qualifies for Shingles Vaccine? Yes   Zostavax completed Yes   Shingrix Completed?: Yes  Screening Tests Health Maintenance  Topic Date Due   Hepatitis C Screening  Never done   COVID-19 Vaccine (7 - 2023-24 season) 11/29/2022 (Originally 10/18/2022)   DTaP/Tdap/Td (2 - Td or Tdap) 02/01/2023   Medicare Annual Wellness (AWV)  11/27/2023   MAMMOGRAM  02/14/2024   Colonoscopy  12/25/2025   INFLUENZA VACCINE  Completed   DEXA SCAN  Completed   HPV VACCINES  Aged Out   Pneumonia Vaccine 70+ Years old  Discontinued   Zoster Vaccines- Shingrix  Discontinued    Health Maintenance  Health Maintenance Due  Topic Date Due   Hepatitis C Screening  Never done    Colorectal cancer screening: Type of screening: Colonoscopy. Completed 12/26/2015. Repeat every 10 years  Mammogram status: Completed 02/13/2022. Repeat every year  Bone Density status: Completed 10/25/2019.   Lung Cancer Screening: (Low Dose CT Chest recommended if Age 32-80 years, 20 pack-year currently smoking OR have quit w/in 15years.) does not qualify.   Lung Cancer Screening Referral: no  Additional Screening:  Hepatitis C Screening: does qualify;   Vision Screening: Recommended annual ophthalmology exams for early detection of glaucoma and other disorders of the eye. Is the patient up to date with their annual eye exam?  Yes  Who is the provider or what is the name of the office in which the patient attends annual eye exams? [facility] If pt is not established with a provider, would they like to be referred to a provider to establish care? No .   Dental Screening: Recommended annual dental exams for proper oral hygiene  Diabetic Foot Exam: n/a  Community Resource Referral / Chronic Care Management: CRR required this visit?  No   CCM required this visit?  No     Plan:     I have personally reviewed and noted the following in the patient's chart:   Medical and social history Use of  alcohol, tobacco or illicit drugs  Current medications and supplements including opioid prescriptions. Patient is not currently taking opioid prescriptions. Functional ability and status Nutritional status Physical activity Advanced directives List of other physicians Hospitalizations, surgeries, and ER visits in previous 12 months Vitals Screenings to include cognitive, depression, and falls Referrals and appointments  In addition, I have reviewed and discussed with patient certain preventive protocols, quality metrics, and best practice recommendations. A written personalized care plan for preventive services as well as general preventive health recommendations were provided to patient.     Barb Merino, LPN   16/11/9602   After Visit Summary: (MyChart) Due to this being a telephonic visit, the after visit summary with patients personalized plan was offered to patient via MyChart   Nurse Notes: none

## 2022-11-27 NOTE — Patient Instructions (Addendum)
Jade Williams , Thank you for taking time to come for your Medicare Wellness Visit. I appreciate your ongoing commitment to your health goals. Please review the following plan we discussed and let me know if I can assist you in the future.   Referrals/Orders/Follow-Ups/Clinician Recommendations: none  This is a list of the screening recommended for you and due dates:  Health Maintenance  Topic Date Due   Hepatitis C Screening  Never done   COVID-19 Vaccine (7 - 2023-24 season) 11/29/2022*   DTaP/Tdap/Td vaccine (2 - Td or Tdap) 02/01/2023   Medicare Annual Wellness Visit  11/27/2023   Mammogram  02/14/2024   Colon Cancer Screening  12/25/2025   Flu Shot  Completed   DEXA scan (bone density measurement)  Completed   HPV Vaccine  Aged Out   Pneumonia Vaccine  Discontinued   Zoster (Shingles) Vaccine  Discontinued  *Topic was postponed. The date shown is not the original due date.    Advanced directives: (Copy Requested) Please bring a copy of your health care power of attorney and living will to the office to be added to your chart at your convenience.  Next Medicare Annual Wellness Visit scheduled for next year: No, will make next year  Insert Preventive Care attachment Insert FALL PREVENTION attachment if needed

## 2023-01-28 ENCOUNTER — Ambulatory Visit: Payer: Medicare Other | Admitting: Internal Medicine

## 2023-01-28 ENCOUNTER — Encounter: Payer: Self-pay | Admitting: Internal Medicine

## 2023-01-28 VITALS — BP 128/80 | HR 70 | Temp 98.2°F | Ht 62.0 in | Wt 150.2 lb

## 2023-01-28 DIAGNOSIS — J069 Acute upper respiratory infection, unspecified: Secondary | ICD-10-CM | POA: Diagnosis not present

## 2023-01-28 MED ORDER — AZITHROMYCIN 250 MG PO TABS: ORAL_TABLET | ORAL | 0 refills | Status: AC

## 2023-01-28 NOTE — Patient Instructions (Signed)
Continue tussin DM, loratadine, flonase  Can do nedipot sinus rinse  Continue Cephalexin antibiotic for 7 days

## 2023-01-28 NOTE — Progress Notes (Signed)
[facility] [address] [place] [address] Dept: [phone] Dept Fax: [phone]  Acute Care Office Visit  Subjective:   Jade Williams 10/27/1950 01/28/2023  Chief Complaint  Patient presents with   Nasal Congestion    Feeling today Started 2 weeks ago     HPI: Discussed the use of AI scribe software for clinical note transcription with the patient, who gave verbal consent to proceed.  History of Present Illness   The patient reports a two-week history of sinus congestion and drainage, which has improved but not completely resolved. They describe the drainage as 'white' and initially noticed 'red specks' when blowing their nose. They deny fever, shortness of breath, chest pain, and wheezing.  They have not experienced any earache or sore throat. They are going out of the [place] on a river cruise on Saturday.   Over-the-counter treatments include Tussin DM, aspirin, loratadine, bee pollen throat spray, Flonase, and homeopathic bee pollen nasal spray. They also report a recent sty in their eye, for which they received antibiotics (Keflex) from an ophthalmologist. They have been taking these antibiotics for the past three days.      The following portions of the patient's history were reviewed and updated as appropriate: past medical history, past surgical history, family history, social history, allergies, medications, and problem list.   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   Pernicious anemia 04/03/2021   Seasonal allergies 04/02/2021   Anxiety 04/02/2021   Hyperlipidemia 04/02/2021   Primary hypertension 04/02/2021   Gastroesophageal reflux disease 01/08/2021   Large hiatal hernia 01/08/2021   Past Medical History:  Diagnosis Date   Anxiety    Back pain    Barrett's esophagus 01/17/2021   Depression    GERD (gastroesophageal reflux disease)     Hyperlipidemia    Hypertension    Osteopenia 11/14/2019   T score -1.6   Pernicious anemia    Past Surgical History:  Procedure Laterality Date   APPENDECTOMY     HEMORRHOID BANDING     TONSILLECTOMY     WISDOM TOOTH EXTRACTION     Family History  Problem Relation Age of Onset   Hyperlipidemia Mother    Hypertension Mother    Stroke Mother    Heart disease Mother    Macular degeneration Mother    Cancer Father        lung cancer   Cancer Sister        lung cancer   Colon cancer Neg Hx    Colon polyps Neg Hx    Esophageal cancer Neg Hx    Rectal cancer Neg Hx    Stomach cancer Neg Hx     Current Outpatient Medications:    ascorbic acid (VITAMIN C) 500 MG tablet, Take 500 mg by mouth daily., Disp: , Rfl:    atorvastatin (LIPITOR) 10 MG tablet, TAKE 1 TABLET BY MOUTH EVERY DAY, Disp: 90 tablet, Rfl: 1   azithromycin (ZITHROMAX) 250 MG tablet, Take 2 tablets on day 1, then 1 tablet daily on days 2 through 5, Disp: 6 tablet, Rfl: 0   b complex vitamins capsule, Take 1 capsule by mouth daily., Disp: , Rfl:    Bioflavonoid Products (VITAMIN C) CHEW, Chew by mouth., Disp: , Rfl:    calcium carbonate (OS-CAL - DOSED IN MG OF ELEMENTAL CALCIUM) 1250 (500 Ca) MG tablet, Take 2 tablets by mouth daily., Disp: , Rfl:  cephALEXin (KEFLEX) 500 MG capsule, Take 500 mg by mouth 2 (two) times daily., Disp: , Rfl:    cholecalciferol (VITAMIN D) 1000 UNITS tablet, Take 5,000 Units by mouth daily. , Disp: , Rfl:    Coenzyme Q10 (CO Q 10 PO), Take by mouth., Disp: , Rfl:    cyanocobalamin (VITAMIN B12) 1000 MCG/ML injection, INJECT 1 ML INTRAMUSCULARLY EVERY 30 DAYS, Disp: 1 mL, Rfl: 11   Docosahexaenoic Acid (DHA OMEGA 3 PO), Take 1 capsule by mouth daily., Disp: , Rfl:    loratadine (CLARITIN) 10 MG tablet, Take 10 mg by mouth as needed., Disp: , Rfl:    MAGNESIUM CARBONATE PO, Take by mouth., Disp: , Rfl:    sertraline (ZOLOFT) 100 MG tablet, TAKE 1 TABLET BY MOUTH EVERY DAY, Disp: 90  tablet, Rfl: 1   valsartan (DIOVAN) 160 MG tablet, TAKE 1 TABLET BY MOUTH EVERY DAY, Disp: 90 tablet, Rfl: 1   vitamin E 400 UNIT capsule, Take 400 Units by mouth daily., Disp: , Rfl:    Multiple Vitamins-Minerals (ICAPS AREDS 2 PO), Take 1 capsule by mouth daily. (Patient not taking: Reported on 01/28/2023), Disp: , Rfl:    OVER THE COUNTER MEDICATION, Trace Minerals Daily (Patient not taking: Reported on 01/28/2023), Disp: , Rfl:  Allergies  Allergen Reactions   Sulfa Antibiotics Other (See Comments)    Gave funny feeling     ROS: A complete ROS was performed with pertinent positives/negatives noted in the HPI. The remainder of the ROS are negative.    Objective:   Today's Vitals   01/28/23 1508  BP: 128/80  Pulse: 70  Temp: 98.2 F (36.8 C)  TempSrc: Temporal  SpO2: 98%  Weight: 150 lb 3.2 oz (68.1 kg)  Height: 5\' 2"  (1.575 m)    GENERAL: Well-appearing, in NAD. Well nourished.  SKIN: Pink, warm and dry. No rash.  HEENT:    HEAD: Normocephalic, non-traumatic.  EYES: Conjunctive pink without exudate. PERRL.  EARS: External ear w/o redness, swelling, masses, or lesions. EAC clear. TM's intact, translucent w/o bulging, appropriate landmarks visualized.  NOSE: Septum midline w/o deformity. Nares patent, mucosa pink and inflamed with drainage. No sinus tenderness.  THROAT: Uvula midline. Oropharynx with minimal clear-white PND. Tonsils absent. Mucus membranes pink and moist.  NECK: Trachea midline. Full ROM w/o pain or tenderness. No lymphadenopathy.  RESPIRATORY: Chest wall symmetrical. Respirations even and non-labored. Breath sounds clear to auscultation bilaterally.  CARDIAC: S1, S2 present, regular rate and rhythm. Peripheral pulses 2+ bilaterally.  EXTREMITIES: Without clubbing, cyanosis, or edema.  NEUROLOGIC: Steady, even gait.  PSYCH/MENTAL STATUS: Alert, oriented x 3. Cooperative, appropriate mood and affect.    No results found for any visits on 01/28/23.     Assessment & Plan:  Assessment and Plan    Upper Respiratory Infection Persistent sinus congestion and postnasal drainage for two weeks. No fever, shortness of breath, or chest pain. Currently on over-the-counter medications including Tussin DM, Aspirin, Loratadine, bee pollen spray, and Flonase. Also taking Cephalexin for a concurrent eye infection. -Continue current over-the-counter medications. -Complete the course of Cephalexin. -Prescribe Azithromycin (Z-Pak) only to be taken if symptoms worsen while traveling. -Consider using a neti pot or sinus rinse to help clear drainage. -Check in with medical services on the cruise if symptoms worsen. -Continue taking Vitamin C and other immune support supplements. -Practice good hand hygiene and surface cleaning      Meds ordered this encounter  Medications   azithromycin (ZITHROMAX) 250 MG tablet  Sig: Take 2 tablets on day 1, then 1 tablet daily on days 2 through 5    Dispense:  6 tablet    Refill:  0    Supervising Provider:   Garnette Gunner [[id number]]   No orders of the defined types were placed in this encounter.  Lab Orders  No laboratory test(s) ordered today   No images are attached to the encounter or orders placed in the encounter.  Return if symptoms worsen or fail to improve.   Salvatore Decent, FNP

## 2023-02-08 ENCOUNTER — Ambulatory Visit: Payer: Medicare Other | Admitting: Internal Medicine

## 2023-02-08 ENCOUNTER — Encounter: Payer: Self-pay | Admitting: Internal Medicine

## 2023-02-08 VITALS — BP 124/80 | HR 74 | Temp 97.9°F | Ht 62.0 in | Wt 153.8 lb

## 2023-02-08 DIAGNOSIS — J40 Bronchitis, not specified as acute or chronic: Secondary | ICD-10-CM

## 2023-02-08 MED ORDER — ALBUTEROL SULFATE HFA 108 (90 BASE) MCG/ACT IN AERS: 2.0000 | INHALATION_SPRAY | Freq: Four times a day (QID) | RESPIRATORY_TRACT | 1 refills | Status: AC | PRN

## 2023-02-08 MED ORDER — METHYLPREDNISOLONE SODIUM SUCC 125 MG IJ SOLR
125.0000 mg | Freq: Once | INTRAMUSCULAR | Status: AC
  Administered 2023-02-08: 125 mg via INTRAMUSCULAR

## 2023-02-08 NOTE — Patient Instructions (Signed)
Continue Loratadine and Flonase  Can use OTC cough suppressants (Tussin)  Start Zpack (Azithromycin) today

## 2023-02-08 NOTE — Progress Notes (Signed)
[facility] [address] [place] [address] Dept: [phone] Dept Fax: [phone]  Acute Care Office Visit  Subjective:   Jade Williams [birth year] 02/08/2023  Chief Complaint  Patient presents with   Cough    Not getting better    HPI: Discussed the use of AI scribe software for clinical note transcription with the patient, who gave verbal consent to proceed.  History of Present Illness   The patient presents with a persistent cough ongoing for the past 2 weeks. They recently returned from a trip abroad, during which they continued to experience these symptoms. Prior to the trip, they were prescribed Keflex by their ophthalmologist and noticed some improvement in their sinus congestion. Patient was seen by this provider on 12/12, Zpack Rx provided to patient and instructed to use if symptoms worsened or did not improve while traveling abroad. Patient did not take medication during trip.  However, the cough has persisted. They have been using over-the-counter cough suppressants and have completed a course of Flonase nasal spray. They also report some sinus congestion and drainage, particularly after laying down.       The following portions of the patient's history were reviewed and updated as appropriate: past medical history, past surgical history, family history, social history, allergies, medications, and problem list.   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   Pernicious anemia 04/03/2021   Seasonal allergies 04/02/2021   Anxiety 04/02/2021   Hyperlipidemia 04/02/2021   Primary hypertension 04/02/2021   Gastroesophageal reflux disease 01/08/2021   Large hiatal hernia 01/08/2021   Past Medical History:  Diagnosis Date   Anxiety    Back pain    Barrett's esophagus 01/17/2021   Depression    GERD (gastroesophageal reflux disease)    Hyperlipidemia     Hypertension    Osteopenia 11/14/2019   T score -1.6   Pernicious anemia    Past Surgical History:  Procedure Laterality Date   APPENDECTOMY     HEMORRHOID BANDING     TONSILLECTOMY     WISDOM TOOTH EXTRACTION     Family History  Problem Relation Age of Onset   Hyperlipidemia Mother    Hypertension Mother    Stroke Mother    Heart disease Mother    Macular degeneration Mother    Cancer Father        lung cancer   Cancer Sister        lung cancer   Colon cancer Neg Hx    Colon polyps Neg Hx    Esophageal cancer Neg Hx    Rectal cancer Neg Hx    Stomach cancer Neg Hx     Current Outpatient Medications:    albuterol (VENTOLIN HFA) 108 (90 Base) MCG/ACT inhaler, Inhale 2 puffs into the lungs every 6 (six) hours as needed for wheezing or shortness of breath., Disp: 18 g, Rfl: 1   ascorbic acid (VITAMIN C) 500 MG tablet, Take 500 mg by mouth daily., Disp: , Rfl:    atorvastatin (LIPITOR) 10 MG tablet, TAKE 1 TABLET BY MOUTH EVERY DAY, Disp: 90 tablet, Rfl: 1   b complex vitamins capsule, Take 1 capsule by mouth daily., Disp: , Rfl:    Bioflavonoid Products (VITAMIN C) CHEW, Chew by mouth., Disp: , Rfl:    calcium carbonate (OS-CAL - DOSED IN MG OF ELEMENTAL CALCIUM) 1250 (500 Ca) MG tablet, Take 2 tablets by mouth daily., Disp: ,  Rfl:    cephALEXin (KEFLEX) 500 MG capsule, Take 500 mg by mouth 2 (two) times daily., Disp: , Rfl:    cholecalciferol (VITAMIN D) 1000 UNITS tablet, Take 5,000 Units by mouth daily. , Disp: , Rfl:    Coenzyme Q10 (CO Q 10 PO), Take by mouth., Disp: , Rfl:    cyanocobalamin (VITAMIN B12) 1000 MCG/ML injection, INJECT 1 ML INTRAMUSCULARLY EVERY 30 DAYS, Disp: 1 mL, Rfl: 11   Docosahexaenoic Acid (DHA OMEGA 3 PO), Take 1 capsule by mouth daily., Disp: , Rfl:    loratadine (CLARITIN) 10 MG tablet, Take 10 mg by mouth as needed., Disp: , Rfl:    MAGNESIUM CARBONATE PO, Take by mouth., Disp: , Rfl:    Multiple Vitamins-Minerals (ICAPS AREDS 2 PO), Take 1  capsule by mouth daily., Disp: , Rfl:    OVER THE COUNTER MEDICATION, Trace Minerals Daily, Disp: , Rfl:    sertraline (ZOLOFT) 100 MG tablet, TAKE 1 TABLET BY MOUTH EVERY DAY, Disp: 90 tablet, Rfl: 1   valsartan (DIOVAN) 160 MG tablet, TAKE 1 TABLET BY MOUTH EVERY DAY, Disp: 90 tablet, Rfl: 1   vitamin E 400 UNIT capsule, Take 400 Units by mouth daily., Disp: , Rfl:  Allergies  Allergen Reactions   Sulfa Antibiotics Other (See Comments)    Gave funny feeling     ROS: A complete ROS was performed with pertinent positives/negatives noted in the HPI. The remainder of the ROS are negative.    Objective:   Today's Vitals   02/08/23 1042  BP: 124/80  Pulse: 74  Temp: 97.9 F (36.6 C)  TempSrc: Temporal  SpO2: 98%  Weight: 153 lb 12.8 oz (69.8 kg)  Height: 5\' 2"  (1.575 m)    GENERAL: Well-appearing, in NAD. Well nourished.  SKIN: Pink, warm and dry. No rash.  HEENT:    HEAD: Normocephalic, non-traumatic.  EYES: Conjunctive pink without exudate. PERRL.  EARS: External ear w/o redness, swelling, masses, or lesions. EAC clear. TM's intact, translucent w/o bulging, appropriate landmarks visualized.  NOSE: Septum midline w/o deformity. Nares patent, mucosa pink and non-inflamed w/o drainage. No sinus tenderness.  THROAT: Uvula midline. Oropharynx clear. Tonsils absent. Mucus membranes pink and moist.  NECK: Trachea midline. Full ROM w/o pain or tenderness. No lymphadenopathy.  RESPIRATORY: Chest wall symmetrical. Respirations even and non-labored. Breath sounds clear to auscultation bilaterally. (+) deep bronchial cough CARDIAC: S1, S2 present, regular rate and rhythm. Peripheral pulses 2+ bilaterally.  EXTREMITIES: Without clubbing, cyanosis, or edema.  NEUROLOGIC: Steady, even gait.  PSYCH/MENTAL STATUS: Alert, oriented x 3. Cooperative, appropriate mood and affect.    No results found for any visits on 02/08/23.    Assessment & Plan:  Assessment and Plan     Bronchitis Persistent cough, despite improvement in sinus congestion. No fever, shortness of breath, chest pain, or wheezing. -Administer Solu-Medrol 125mg  today. -Start Albuterol inhaler, 2 puffs every 4-6 hours as needed for cough or chest tightness. -Start Azithromycin (Z-Pak) today. -Continue Loratadine and Flonase. -Continue over-the-counter cough suppressants such as Tussin -Follow up if symptoms worsen or fail to improve.      Meds ordered this encounter  Medications   albuterol (VENTOLIN HFA) 108 (90 Base) MCG/ACT inhaler    Sig: Inhale 2 puffs into the lungs every 6 (six) hours as needed for wheezing or shortness of breath.    Dispense:  18 g    Refill:  1    Supervising Provider:   Garnette Gunner [[id number]]   methylPREDNISolone  sodium succinate (SOLU-MEDROL) 125 mg/2 mL injection 125 mg   No orders of the defined types were placed in this encounter.  Lab Orders  No laboratory test(s) ordered today   No images are attached to the encounter or orders placed in the encounter.  Return if symptoms worsen or fail to improve.   Salvatore Decent, FNP

## 2023-02-23 ENCOUNTER — Other Ambulatory Visit: Payer: Self-pay | Admitting: Nurse Practitioner

## 2023-02-24 NOTE — Telephone Encounter (Signed)
 Requesting: CYANOCOBALAMIN 1,000 MCG/ML VL  Last Visit: 11/13/2022 Next Visit: Visit date not found Last Refill: 02/17/2022  Please Advise

## 2023-02-26 ENCOUNTER — Ambulatory Visit (INDEPENDENT_AMBULATORY_CARE_PROVIDER_SITE_OTHER): Payer: Medicare Other | Admitting: Nurse Practitioner

## 2023-02-26 ENCOUNTER — Encounter: Payer: Self-pay | Admitting: Nurse Practitioner

## 2023-02-26 ENCOUNTER — Ambulatory Visit: Payer: Self-pay | Admitting: Nurse Practitioner

## 2023-02-26 VITALS — BP 160/80 | HR 65 | Temp 98.8°F | Resp 18 | Wt 153.0 lb

## 2023-02-26 DIAGNOSIS — L293 Anogenital pruritus, unspecified: Secondary | ICD-10-CM

## 2023-02-26 DIAGNOSIS — N390 Urinary tract infection, site not specified: Secondary | ICD-10-CM | POA: Diagnosis not present

## 2023-02-26 DIAGNOSIS — R3121 Asymptomatic microscopic hematuria: Secondary | ICD-10-CM | POA: Diagnosis not present

## 2023-02-26 LAB — POC URINALSYSI DIPSTICK (AUTOMATED)
Bilirubin, UA: NEGATIVE
Blood, UA: POSITIVE
Glucose, UA: NEGATIVE
Ketones, UA: NEGATIVE
Nitrite, UA: NEGATIVE
Protein, UA: NEGATIVE
Spec Grav, UA: 1.015 (ref 1.010–1.025)
Urobilinogen, UA: 2 U/dL — AB
pH, UA: 6 (ref 5.0–8.0)

## 2023-02-26 NOTE — Progress Notes (Signed)
 Acute Office Visit  Subjective:    Patient ID: Jade Williams, female    DOB: [birth year], 73 y.o.   MRN: [id number]  Chief Complaint  Patient presents with   Acute Visit    PT C/O of itchiness, and burning sensation for 1-2 weeks.     HPI Patient is in today for perineal itching and burning sensation after urination No dysuria or urinary frequency or pelvic pain, no vaginal itching or discharge or odor. Not sexually active, no incontinence, no use of pads  BP Readings from Last 3 Encounters:  02/26/23 (!) 160/80  02/08/23 124/80  01/28/23 128/80    Outpatient Medications Prior to Visit  Medication Sig   albuterol  (VENTOLIN  HFA) 108 (90 Base) MCG/ACT inhaler Inhale 2 puffs into the lungs every 6 (six) hours as needed for wheezing or shortness of breath.   ascorbic acid (VITAMIN C) 500 MG tablet Take 500 mg by mouth daily.   atorvastatin  (LIPITOR) 10 MG tablet TAKE 1 TABLET BY MOUTH EVERY DAY   b complex vitamins capsule Take 1 capsule by mouth daily.   Bioflavonoid Products (VITAMIN C) CHEW Chew by mouth.   calcium  carbonate (OS-CAL - DOSED IN MG OF ELEMENTAL CALCIUM ) 1250 (500 Ca) MG tablet Take 2 tablets by mouth daily.   cholecalciferol (VITAMIN D) 1000 UNITS tablet Take 5,000 Units by mouth daily.    Coenzyme Q10 (CO Q 10 PO) Take by mouth.   cyanocobalamin  (VITAMIN B12) 1000 MCG/ML injection INJECT 1 ML INTRAMUSCULARLY EVERY 30 DAYS   Docosahexaenoic Acid (DHA OMEGA 3 PO) Take 1 capsule by mouth daily.   fluconazole  (DIFLUCAN ) 150 MG tablet Take 150 mg by mouth once.   FLUZONE HIGH-DOSE 0.5 ML injection    loratadine (CLARITIN) 10 MG tablet Take 10 mg by mouth as needed.   MAGNESIUM CARBONATE PO Take by mouth.   Multiple Vitamins-Minerals (ICAPS AREDS 2 PO) Take 1 capsule by mouth daily.   sertraline  (ZOLOFT ) 100 MG tablet TAKE 1 TABLET BY MOUTH EVERY DAY   valsartan  (DIOVAN ) 160 MG tablet TAKE 1 TABLET BY MOUTH EVERY DAY   vitamin E 400 UNIT capsule Take 400 Units  by mouth daily.   [DISCONTINUED] cephALEXin  (KEFLEX ) 500 MG capsule Take 500 mg by mouth 2 (two) times daily.   [DISCONTINUED] OVER THE COUNTER MEDICATION Trace Minerals Daily   No facility-administered medications prior to visit.    Reviewed past medical and social history.  Review of Systems Per HPI     Objective:    Physical Exam Vitals and nursing note reviewed. Exam conducted with a chaperone present.  Abdominal:     Tenderness: There is no abdominal tenderness.  Genitourinary:    General: Normal vulva.     Pubic Area: No rash.      Labia:        Right: No rash or tenderness.        Left: No rash or tenderness.      Urethra: No urethral pain or urethral swelling.  Neurological:     Mental Status: She is alert.    BP (!) 160/80 (BP Location: Right Arm, Patient Position: Sitting, Cuff Size: Large) Comment: recheck manual  Pulse 65   Temp 98.8 F (37.1 C) (Oral)   Resp 18   Wt 153 lb (69.4 kg)   SpO2 98%   BMI 27.98 kg/m    Results for orders placed or performed in visit on 02/26/23  POCT Urinalysis Dipstick (Automated)  Result Value Ref Range  Color, UA yellow    Clarity, UA clear    Glucose, UA Negative Negative   Bilirubin, UA negative    Ketones, UA negative    Spec Grav, UA 1.015 1.010 - 1.025   Blood, UA positive    pH, UA 6.0 5.0 - 8.0   Protein, UA Negative Negative   Urobilinogen, UA 2.0 (A) 0.2 or 1.0 E.U./dL   Nitrite, UA negative    Leukocytes, UA Small (1+) (A) Negative      Assessment & Plan:   Problem List Items Addressed This Visit   None Visit Diagnoses       Perineal itching, female    -  Primary   Relevant Orders   POCT Urinalysis Dipstick (Automated) (Completed)   Urine Culture     Asymptomatic microscopic hematuria       Relevant Orders   Urine Culture     Advised to Monitor BP at home and f/up with pcp. Advised to use  a thin layer of OVER THE COUNTER cortisone cream on perineum region prn. Advised to use replenish  vaginal insert for vaginal dryness. Advised to discussed use of estradiol cream with pcp.  No orders of the defined types were placed in this encounter.  Return if symptoms worsen or fail to improve.    Roselie Mood, NP

## 2023-02-26 NOTE — Telephone Encounter (Signed)
 Noted.

## 2023-02-26 NOTE — Patient Instructions (Signed)
 Urine is sent for culture. Ok to use replenish for vaginal dryness. Ok to use unscented vasceline or thin layer of hydrocortisone on perineal region if itching persist

## 2023-02-26 NOTE — Telephone Encounter (Signed)
 Copied from CRM [id number]. Topic: Clinical - Pink Word Triage >> Feb 26, 2023  8:15 AM Pinkey ORN wrote: Reason for Triage: Possible UTI   Chief Complaint: uti symptoms Symptoms: urgency, nocturia, burning, one episode of incontinence Frequency: ongoing 1 week Pertinent Negatives: Patient denies fever frequency or flank pain Disposition: [] ED /[] Urgent Care (no appt availability in office) / [x] Appointment(In office/virtual)/ []  [place] [facility]/ [] Home Care/ [] Refused Recommended Disposition /[] [place] [facility]/ []  Follow-up with PCP Additional Notes: Patient reported urinary urgency and a burning itching pain behind my urethra that has been ongoing for one week.  She inquired if taking a zpack could have caused a uti.   She took a home test that cam back positive for a uti.  She has an appointment at 1:40 pm today but wanted to be seen sooner if possible.  She was placed on the cancellation list.   Reason for Disposition  [1] Can't control passage of urine (i.e., urinary incontinence) AND [2] new-onset (< 2 weeks) or worsening  Answer Assessment - Initial Assessment Questions 1. SYMPTOM: What's the main symptom you're concerned about? (e.g., frequency, incontinence)     Pain burning and itching after urination  Incontinence Urgency  2. ONSET:      1 week 3. PAIN: Is there any pain? If Yes, ask: How bad is it? (Scale: 1-10; mild, moderate, severe)     2-3/10 4. CAUSE: What do you think is causing the symptoms?     Possible uti Home test - positive  5. OTHER SYMPTOMS: Do you have any other symptoms? (e.g., blood in urine, fever, flank pain, pain with urination)     None  Protocols used: Urinary Symptoms-A-AH

## 2023-03-01 DIAGNOSIS — R3121 Asymptomatic microscopic hematuria: Secondary | ICD-10-CM | POA: Diagnosis not present

## 2023-03-01 DIAGNOSIS — L293 Anogenital pruritus, unspecified: Secondary | ICD-10-CM | POA: Diagnosis not present

## 2023-03-03 LAB — URINE CULTURE
MICRO NUMBER:: 15946896
SPECIMEN QUALITY:: ADEQUATE

## 2023-03-03 MED ORDER — CEPHALEXIN 500 MG PO CAPS: 500.0000 mg | ORAL_CAPSULE | Freq: Two times a day (BID) | ORAL | 0 refills | Status: DC

## 2023-03-03 NOTE — Addendum Note (Signed)
 Addended by: KATHEEN GARDEN L on: 03/03/2023 02:02 PM   Modules accepted: Orders

## 2023-03-09 ENCOUNTER — Ambulatory Visit: Payer: Self-pay | Admitting: Nurse Practitioner

## 2023-03-09 NOTE — Telephone Encounter (Signed)
I called  and spoke with patient and she is not taking Keflex that was sent in on 03/03/23. Patient is taking an old Rx of Ciprofloxacin 500 mg that was last prescribed on 11/27/2022 by Leotis Shames. She said that she did not take any of the Cipro today and does not have any bloating but is requesting a Rx of Cipro 250mg .

## 2023-03-09 NOTE — Telephone Encounter (Signed)
Chief Complaint: Medication side effect Symptoms: Constipation and bloating Frequency: Today Pertinent Negatives: Patient denies relief Disposition: [] ED /[] Urgent Care (no appt availability in office) / [] Appointment(In office/virtual)/ []  [place] [facility]/ [] Home Care/ [] Refused Recommended Disposition /[] [facility]/ [x]  Follow-up with PCP Additional Notes: Patient called in to report constipation and bloating after she started taking ciprofloxacin 500 mg. Patient stated she was seen in the office recently and treated for a UTI. Patient is requesting a lower dosage of the medication or an alternative. Patient stated that her UTI symptoms have gone away, but she still has 3 pills left take. Patient advised that she has taken ciprofloxacin 250 mg in the past with no issues. Advised patient that I would route conversation to clinic for their recommendation. Patient is expecting a call back.   Copied from CRM [id number]. Topic: Clinical - Medical Advice >> Mar 09, 2023 10:31 AM Denese Killings wrote: Reason for CRM: Patient is having side effects from while taking antibiotics azithromycin. Patient wants to know if she should discontinue or take another type of antibiotics. Reason for Disposition  [1] Caller has NON-URGENT medicine question about med that PCP prescribed AND [2] triager unable to answer question  Answer Assessment - Initial Assessment Questions 1. NAME of MEDICINE: "What medicine(s) are you calling about?"     Ciprofloxacin 500 mg 2. QUESTION: "What is your question?" (e.g., double dose of medicine, side effect)     Side effect 3. PRESCRIBER: "Who prescribed the medicine?" Reason: if prescribed by specialist, call should be referred to that group.     Rodman Pickle, NP 4. SYMPTOMS: "Do you have any symptoms?" If Yes, ask: "What symptoms are you having?"  "How bad are the symptoms (e.g., mild, moderate, severe)     Constipation and bloating  Protocols used: Medication  Question Call-A-AH

## 2023-03-10 NOTE — Telephone Encounter (Signed)
I called and spoke with patient and notified her of below message. Patient said that she has taken Cipro 500mg  Twice daily for 4 days and feels fine. She questions if she really needs to take the Keflex after taking cipro for that long. Patient also said that she is going to store to get an OTC test for UTI for a recheck.

## 2023-03-10 NOTE — Telephone Encounter (Signed)
I called and left patient a detailed message regarding message from Alysia Penna, NP.

## 2023-04-14 DIAGNOSIS — I10 Essential (primary) hypertension: Secondary | ICD-10-CM | POA: Diagnosis not present

## 2023-04-14 DIAGNOSIS — I700 Atherosclerosis of aorta: Secondary | ICD-10-CM | POA: Diagnosis not present

## 2023-04-14 DIAGNOSIS — E7800 Pure hypercholesterolemia, unspecified: Secondary | ICD-10-CM | POA: Diagnosis not present

## 2023-04-15 LAB — LIPID PANEL
Chol/HDL Ratio: 3.1 {ratio} (ref 0.0–4.4)
Cholesterol, Total: 199 mg/dL (ref 100–199)
HDL: 65 mg/dL (ref >39)
LDL Chol Calc (NIH): 109 mg/dL — ABNORMAL HIGH (ref 0–99)
Triglycerides: 146 mg/dL (ref 0–149)
VLDL Cholesterol Cal: 25 mg/dL (ref 5–40)

## 2023-04-20 ENCOUNTER — Ambulatory Visit (INDEPENDENT_AMBULATORY_CARE_PROVIDER_SITE_OTHER): Payer: Medicare Other | Admitting: Cardiology

## 2023-04-20 ENCOUNTER — Encounter (HOSPITAL_BASED_OUTPATIENT_CLINIC_OR_DEPARTMENT_OTHER): Payer: Self-pay | Admitting: Cardiology

## 2023-04-20 VITALS — BP 124/76 | HR 76 | Ht 62.0 in | Wt 147.5 lb

## 2023-04-20 DIAGNOSIS — I700 Atherosclerosis of aorta: Secondary | ICD-10-CM

## 2023-04-20 DIAGNOSIS — I10 Essential (primary) hypertension: Secondary | ICD-10-CM

## 2023-04-20 DIAGNOSIS — E7800 Pure hypercholesterolemia, unspecified: Secondary | ICD-10-CM

## 2023-04-20 DIAGNOSIS — Z7189 Other specified counseling: Secondary | ICD-10-CM | POA: Diagnosis not present

## 2023-04-20 NOTE — Progress Notes (Signed)
  Cardiology Office Note:  .   Date:  04/20/2023  ID:  Jade Williams, DOB 1950-03-20, MRN [id number] PCP: Gerre Scull, NP  [facility] Providers Cardiologist:  Jodelle Red, MD {  History of Present Illness: Jade Williams is a 73 y.o. female with a hx of hypercholesterolemia, hypertension, family history of CAD, and hiatal hernia. Previously seen by Dr. Katrinka Blazing, and I met her 06/19/2022.   CV history: Nuclear stress 2020 without evidence of ischemia. CT chest 2022 with mild aortic atherosclerosis. No comment on coronary calcifications.  Today: Had a URI in December, cough lasted "forever."  Makes almost all her food from scratch. Staying active. Reviewed her recent labs. Had extensive discussion about statins, LDL, PCSK9i, lifestyle management recommendations.  ROS: Denies chest pain, shortness of breath at rest or with normal exertion. No PND, orthopnea, LE edema or unexpected weight gain. No syncope or palpitations. ROS otherwise negative except as noted.   Studies Reviewed: Marland Kitchen    EKG:     not ordered today  Physical Exam:   VS:  BP 124/76   Pulse 76   Ht 5\' 2"  (1.575 m)   Wt 147 lb 8 oz (66.9 kg)   SpO2 95%   BMI 26.98 kg/m    Wt Readings from Last 3 Encounters:  04/20/23 147 lb 8 oz (66.9 kg)  02/26/23 153 lb (69.4 kg)  02/08/23 153 lb 12.8 oz (69.8 kg)    GEN: Well nourished, well developed in no acute distress HEENT: Normal, moist mucous membranes NECK: No JVD CARDIAC: regular rhythm, normal S1 and S2, no rubs or gallops. No murmur. VASCULAR: Radial and DP pulses 2+ bilaterally. No carotid bruits RESPIRATORY:  Clear to auscultation without rales, wheezing or rhonchi  ABDOMEN: Soft, non-tender, non-distended MUSCULOSKELETAL:  Ambulates independently SKIN: Warm and dry, no edema NEUROLOGIC:  Alert and oriented x 3. No focal neuro deficits noted. PSYCHIATRIC:  Normal affect    ASSESSMENT AND PLAN: .   Hypertension -continue  valsartan   Aortic atherosclerosis Hypercholesterolemia -trying to manage with supplements. She wants to avoid increasing statins if at all possible. We have talked about guidelines, risks/benefits.  -discussed science, available recommendations at length today -last ldl 109, goal ldl <70. Was 184 off atorvastatin 01/2022 -we discussed options today. Discussed increasing atorvastatin dose, changing to rosuvastatin, or monitoring  for another 6 mos. She will think about this and let me know  CV risk counseling and prevention -recommend heart healthy/Mediterranean diet, with whole grains, fruits, vegetable, fish, lean meats, nuts, and olive oil. Limit salt. -recommend moderate walking, 3-5 times/week for 30-50 minutes each session. Aim for at least 150 minutes.week. Goal should be pace of 3 miles/hours, or walking 1.5 miles in 30 minutes -recommend avoidance of tobacco products. Avoid excess alcohol.  Dispo: 6 mos  Signed, Jodelle Red, MD   Jodelle Red, MD, PhD, [facility] [place]  [facility]  [place]  [facility] & Vascular at [facility] at [facility] [address] [place], [place] [address] [phone]

## 2023-04-20 NOTE — Patient Instructions (Signed)
 Medication Instructions:  Your physician recommends that you continue on your current medications as directed. Please refer to the Current Medication list given to you today.  Follow-Up: At [facility], you and your health needs are our priority.  As part of our continuing mission to provide you with exceptional heart care, we have created designated Provider Care Teams.  These Care Teams include your primary Cardiologist (physician) and Advanced Practice Providers (APPs -  Physician Assistants and Nurse Practitioners) who all work together to provide you with the care you need, when you need it.  We recommend signing up for the patient portal called "MyChart".  Sign up information is provided on this After Visit Summary.  MyChart is used to connect with patients for Virtual Visits (Telemedicine).  Patients are able to view lab/test results, encounter notes, upcoming appointments, etc.  Non-urgent messages can be sent to your provider as well.   To learn more about what you can do with MyChart, go to [email or web address].    Your next appointment:   6 month(s)  Provider:   Jodelle Red, MD, Gillian Shields, NP or Eligha Bridegroom, NP

## 2023-04-22 DIAGNOSIS — Z85828 Personal history of other malignant neoplasm of skin: Secondary | ICD-10-CM | POA: Diagnosis not present

## 2023-04-22 DIAGNOSIS — L821 Other seborrheic keratosis: Secondary | ICD-10-CM | POA: Diagnosis not present

## 2023-04-22 DIAGNOSIS — L718 Other rosacea: Secondary | ICD-10-CM | POA: Diagnosis not present

## 2023-04-22 DIAGNOSIS — L578 Other skin changes due to chronic exposure to nonionizing radiation: Secondary | ICD-10-CM | POA: Diagnosis not present

## 2023-05-05 ENCOUNTER — Other Ambulatory Visit: Payer: Self-pay | Admitting: Nurse Practitioner

## 2023-05-05 NOTE — Telephone Encounter (Signed)
 Requesting: VALSARTAN 160 MG TABLET , SERTRALINE HCL 100 MG TABLET  Last Visit: 11/13/2022 Next Visit: Visit date not found Last Refill: 11/06/22, 11/13/22  Please Advise

## 2023-08-07 ENCOUNTER — Other Ambulatory Visit: Payer: Self-pay | Admitting: Nurse Practitioner

## 2023-08-09 ENCOUNTER — Other Ambulatory Visit: Payer: Self-pay | Admitting: Nurse Practitioner

## 2023-09-15 DIAGNOSIS — H353131 Nonexudative age-related macular degeneration, bilateral, early dry stage: Secondary | ICD-10-CM | POA: Diagnosis not present

## 2023-09-15 DIAGNOSIS — I10 Essential (primary) hypertension: Secondary | ICD-10-CM | POA: Diagnosis not present

## 2023-09-15 DIAGNOSIS — H35033 Hypertensive retinopathy, bilateral: Secondary | ICD-10-CM | POA: Diagnosis not present

## 2023-09-15 DIAGNOSIS — H2513 Age-related nuclear cataract, bilateral: Secondary | ICD-10-CM | POA: Diagnosis not present

## 2023-09-28 ENCOUNTER — Ambulatory Visit (INDEPENDENT_AMBULATORY_CARE_PROVIDER_SITE_OTHER): Admitting: Nurse Practitioner

## 2023-09-28 ENCOUNTER — Encounter: Payer: Self-pay | Admitting: Nurse Practitioner

## 2023-09-28 VITALS — BP 124/80 | HR 65 | Temp 98.0°F | Ht 62.0 in | Wt 153.2 lb

## 2023-09-28 DIAGNOSIS — K449 Diaphragmatic hernia without obstruction or gangrene: Secondary | ICD-10-CM

## 2023-09-28 DIAGNOSIS — R229 Localized swelling, mass and lump, unspecified: Secondary | ICD-10-CM | POA: Diagnosis not present

## 2023-09-28 DIAGNOSIS — M8580 Other specified disorders of bone density and structure, unspecified site: Secondary | ICD-10-CM | POA: Diagnosis not present

## 2023-09-28 DIAGNOSIS — Z780 Asymptomatic menopausal state: Secondary | ICD-10-CM | POA: Diagnosis not present

## 2023-09-28 DIAGNOSIS — K22719 Barrett's esophagus with dysplasia, unspecified: Secondary | ICD-10-CM

## 2023-09-28 DIAGNOSIS — Z1231 Encounter for screening mammogram for malignant neoplasm of breast: Secondary | ICD-10-CM

## 2023-09-28 NOTE — Patient Instructions (Signed)
 It was great to see you!  I have ordered a mammogram and bone density   Let's do an ultrasound to see what the bump is on your chest  Let's follow-up at your next visit  Take care,  Tinnie Harada, NP

## 2023-09-28 NOTE — Progress Notes (Signed)
 Acute Office Visit  Subjective:     Patient ID: Jade Williams, female    DOB: [birth year], 73 y.o.   MRN: [id number]  Chief Complaint  Patient presents with   Medical Management of Chronic Issues    Wanting a bone density done, have knot on right chest area been there 3 weeks painful if press rub it    HPI Discussed the use of AI scribe software for clinical note transcription with the patient, who gave verbal consent to proceed.  History of Present Illness   Jade Williams is a 73 year old female who presents with a bump by her collarbone noticed about a month ago.  The bump by her right collarbone has been present for about a month, varying in palpability with position and increasing in size. It is painful only with extensive manipulation. There is no fever, sore throat, or cough, but she experiences a consistently stuffy nose and occasional hot flashes. She does not perform regular breast self-exams, and her last mammogram was a couple of years ago. She has not experienced unusual fatigue or feelings of being run-down. Her past medical history includes a hiatal hernia managed with a low acid diet, with no current reflux symptoms or medications. She plans to have blood work at an upcoming cardiology appointment.      ROS See pertinent positives and negatives per HPI.     Objective:    BP 124/80 (BP Location: Right Arm, Patient Position: Sitting, Cuff Size: Normal)   Pulse 65   Temp 98 F (36.7 C) (Temporal)   Ht 5' 2 (1.575 m)   Wt 153 lb 3.2 oz (69.5 kg)   SpO2 97%   BMI 28.02 kg/m    Physical Exam Vitals and nursing note reviewed.  Constitutional:      General: She is not in acute distress.    Appearance: Normal appearance.  HENT:     Head: Normocephalic.     Mouth/Throat:     Mouth: Mucous membranes are moist.     Pharynx: No posterior oropharyngeal erythema.  Eyes:     Conjunctiva/sclera: Conjunctivae normal.  Cardiovascular:     Rate and Rhythm:  Normal rate and regular rhythm.     Pulses: Normal pulses.     Heart sounds: Normal heart sounds.  Pulmonary:     Effort: Pulmonary effort is normal.     Breath sounds: Normal breath sounds.  Chest:     Chest wall: Mass (nodule palpated under right clavicle) present.  Breasts:    Right: No swelling, bleeding or inverted nipple.     Left: Normal. No swelling, bleeding or inverted nipple.    Musculoskeletal:     Cervical back: Normal range of motion and neck supple. No tenderness.  Lymphadenopathy:     Cervical: No cervical adenopathy.     Upper Body:     Right upper body: No supraclavicular, axillary or pectoral adenopathy.     Left upper body: No supraclavicular, axillary or pectoral adenopathy.  Skin:    General: Skin is warm.  Neurological:     General: No focal deficit present.     Mental Status: She is alert and oriented to person, place, and time.  Psychiatric:        Mood and Affect: Mood normal.        Behavior: Behavior normal.        Thought Content: Thought content normal.        Judgment: Judgment normal.  Assessment & Plan:   Problem List Items Addressed This Visit       Respiratory   Large hiatal hernia   Chronic, stable. Currently asymptomatic and managed with a low acid diet. She is interested in re-evaluation for lesions or complications. Refer to gastroenterology for follow-up evaluation and possible endoscopy. Coordinate with Dr. Lillian for continuity of care.         Musculoskeletal and Integument   Osteopenia   Chronic, stable. She is taking calcium  and vitamin D supplement daily. Follow-up dexa scan ordered today.         Other   Lump of skin - Primary   A hard, slightly tender mass in the right supraclavicular region is palpated. Differential diagnosis includes a cyst or lymph node. Her last mammogram was a couple of years ago. Order an ultrasound of the mass to differentiate between a cyst and a lymph node. Order a mammogram to  evaluate for breast abnormalities. Coordinate imaging studies with [facility].       Relevant Orders   MM Digital Diagnostic Bilat   MS US  BREAST LTD UNI RIGHT INC AXILLA   Other Visit Diagnoses       Postmenopausal estrogen deficiency       Dexa scan ordered today to follow-up on osteopenia.   Relevant Orders   DG Bone Density     Barrett's esophagus with dysplasia       Referral placed to GI. She is overdue for endoscopy.   Relevant Orders   Ambulatory referral to Gastroenterology       No orders of the defined types were placed in this encounter.   Return if symptoms worsen or fail to improve.  Tinnie DELENA Harada, NP

## 2023-09-29 ENCOUNTER — Encounter: Payer: Self-pay | Admitting: Nurse Practitioner

## 2023-09-29 DIAGNOSIS — R229 Localized swelling, mass and lump, unspecified: Secondary | ICD-10-CM | POA: Insufficient documentation

## 2023-09-29 NOTE — Assessment & Plan Note (Addendum)
 Chronic, stable. Currently asymptomatic and managed with a low acid diet. She is interested in re-evaluation for lesions or complications. Refer to gastroenterology for follow-up evaluation and possible endoscopy. Coordinate with Dr. Lillian for continuity of care.

## 2023-09-29 NOTE — Assessment & Plan Note (Addendum)
 Chronic, stable. She is taking calcium  and vitamin D supplement daily. Follow-up dexa scan ordered today.

## 2023-09-29 NOTE — Assessment & Plan Note (Signed)
 A hard, slightly tender mass in the right supraclavicular region is palpated. Differential diagnosis includes a cyst or lymph node. Her last mammogram was a couple of years ago. Order an ultrasound of the mass to differentiate between a cyst and a lymph node. Order a mammogram to evaluate for breast abnormalities. Coordinate imaging studies with [facility].

## 2023-10-05 ENCOUNTER — Encounter: Payer: Self-pay | Admitting: Nurse Practitioner

## 2023-10-05 NOTE — Telephone Encounter (Signed)
 Awaiting on response from referral department.

## 2023-10-06 NOTE — Telephone Encounter (Signed)
 I called and spoke with patient to clarify which location that she wanted to go so that I could relay message to our referral coordinator.

## 2023-10-14 DIAGNOSIS — N6312 Unspecified lump in the right breast, upper inner quadrant: Secondary | ICD-10-CM | POA: Diagnosis not present

## 2023-10-14 DIAGNOSIS — R921 Mammographic calcification found on diagnostic imaging of breast: Secondary | ICD-10-CM | POA: Diagnosis not present

## 2023-10-14 LAB — HM MAMMOGRAPHY

## 2023-10-15 ENCOUNTER — Encounter: Payer: Self-pay | Admitting: Nurse Practitioner

## 2023-10-26 ENCOUNTER — Telehealth (HOSPITAL_BASED_OUTPATIENT_CLINIC_OR_DEPARTMENT_OTHER): Payer: Self-pay | Admitting: Cardiology

## 2023-10-26 DIAGNOSIS — I10 Essential (primary) hypertension: Secondary | ICD-10-CM

## 2023-10-26 DIAGNOSIS — E7800 Pure hypercholesterolemia, unspecified: Secondary | ICD-10-CM

## 2023-10-26 NOTE — Telephone Encounter (Signed)
  Patient has appt with Dr Lonni on 11/04/23 and would like to get any needed labs done before her appt. I do not see any orders. Please advise patient if any are needed.

## 2023-10-26 NOTE — Telephone Encounter (Signed)
 Lipid and CMET ordered for patient Left message to call back and mychart message sent to patient

## 2023-10-27 ENCOUNTER — Other Ambulatory Visit

## 2023-10-28 DIAGNOSIS — I10 Essential (primary) hypertension: Secondary | ICD-10-CM | POA: Diagnosis not present

## 2023-10-28 DIAGNOSIS — E7800 Pure hypercholesterolemia, unspecified: Secondary | ICD-10-CM | POA: Diagnosis not present

## 2023-10-28 LAB — LIPID PANEL
Chol/HDL Ratio: 3.3 ratio (ref 0.0–4.4)
Cholesterol, Total: 195 mg/dL (ref 100–199)
HDL: 60 mg/dL (ref >39)
LDL Chol Calc (NIH): 113 mg/dL — ABNORMAL HIGH (ref 0–99)
Triglycerides: 127 mg/dL (ref 0–149)
VLDL Cholesterol Cal: 22 mg/dL (ref 5–40)

## 2023-10-28 LAB — COMPREHENSIVE METABOLIC PANEL WITH GFR
ALT: 24 IU/L (ref 0–32)
AST: 29 IU/L (ref 0–40)
Albumin: 4.4 g/dL (ref 3.8–4.8)
Alkaline Phosphatase: 170 IU/L — ABNORMAL HIGH (ref 44–121)
BUN/Creatinine Ratio: 18 (ref 12–28)
BUN: 17 mg/dL (ref 8–27)
Bilirubin Total: 0.5 mg/dL (ref 0.0–1.2)
CO2: 23 mmol/L (ref 20–29)
Calcium: 9.6 mg/dL (ref 8.7–10.3)
Chloride: 100 mmol/L (ref 96–106)
Creatinine, Ser: 0.93 mg/dL (ref 0.57–1.00)
Globulin, Total: 2.3 g/dL (ref 1.5–4.5)
Glucose: 98 mg/dL (ref 70–99)
Potassium: 5.3 mmol/L — ABNORMAL HIGH (ref 3.5–5.2)
Sodium: 139 mmol/L (ref 134–144)
Total Protein: 6.7 g/dL (ref 6.0–8.5)
eGFR: 64 mL/min/1.73 (ref >59)

## 2023-11-03 NOTE — Progress Notes (Signed)
  Cardiology Office Note:  .   Date:  11/04/2023  ID:  Rollo Kayser, DOB [birth year], MRN [id number] PCP: Nedra Tinnie LABOR, NP  [facility] Providers Cardiologist:  Shelda Bruckner, MD {  History of Present Illness: Jade Williams is a 73 y.o. female with a hx of hypercholesterolemia, hypertension, family history of CAD, and hiatal hernia. Previously seen by Dr. Claudene, and I met her 06/19/2022.   CV history: Nuclear stress 2020 without evidence of ischemia. CT chest 2022 with mild aortic atherosclerosis. No comment on coronary calcifications.  Today: Overall doing well. Can feel a little shortness of breath walking up a hill, especially on a full stomach, but otherwise asymptomatic. Has been very active with yardwork without limitations.   Reviewed lipids from last week showed LDL 113. Discusses statins and guidelines at length, see below. Has worked on Chief Technology Officer, getting more exercise.  ROS: Denies chest pain, shortness of breath at rest or with normal exertion. No PND, orthopnea, LE edema or unexpected weight gain. No syncope or palpitations. ROS otherwise negative except as noted.   Studies Reviewed: SABRA    EKG:      Physical Exam:   VS:  BP 130/64 (BP Location: Left Arm, Patient Position: Sitting, Cuff Size: Normal)   Pulse 71   Resp 17   Ht 5' 2 (1.575 m)   Wt 155 lb (70.3 kg)   SpO2 96%   BMI 28.35 kg/m    Wt Readings from Last 3 Encounters:  11/04/23 155 lb (70.3 kg)  09/28/23 153 lb 3.2 oz (69.5 kg)  04/20/23 147 lb 8 oz (66.9 kg)    GEN: Well nourished, well developed in no acute distress HEENT: Normal, moist mucous membranes NECK: No JVD CARDIAC: regular rhythm, normal S1 and S2, no rubs or gallops. No murmur. VASCULAR: Radial and DP pulses 2+ bilaterally. No carotid bruits RESPIRATORY:  Clear to auscultation without rales, wheezing or rhonchi  ABDOMEN: Soft, non-tender, non-distended MUSCULOSKELETAL:  Ambulates  independently SKIN: Warm and dry, no edema NEUROLOGIC:  Alert and oriented x 3. No focal neuro deficits noted. PSYCHIATRIC:  Normal affect    ASSESSMENT AND PLAN: .   Hypertension -continue valsartan  -recent BMET reviewed   Aortic atherosclerosis Hypercholesterolemia -discussed guidelines and recommendations at length. Discussed statin options today in light of her test results -last ldl 113, goal ldl <70. Was 184 off atorvastatin  01/2022 -we discussed options today. After shared decision making, will increase atorvastatin  to 20 mg and recheck lipids in 2 months. If not at goal, then would increase to 40 mg and recheck.  CV risk counseling and prevention -recommend heart healthy/Mediterranean diet, with whole grains, fruits, vegetable, fish, lean meats, nuts, and olive oil. Limit salt. -recommend moderate walking, 3-5 times/week for 30-50 minutes each session. Aim for at least 150 minutes.week. Goal should be pace of 3 miles/hours, or walking 1.5 miles in 30 minutes -recommend avoidance of tobacco products. Avoid excess alcohol .  Dispo: 12 mos  Signed, Shelda Bruckner, MD   Shelda Bruckner, MD, PhD, [facility]  [facility]  [place]  [facility] at [facility] at [facility] [address], [address] [place], [place] [address] [phone]

## 2023-11-04 ENCOUNTER — Ambulatory Visit (HOSPITAL_BASED_OUTPATIENT_CLINIC_OR_DEPARTMENT_OTHER): Admitting: Cardiology

## 2023-11-04 ENCOUNTER — Encounter (HOSPITAL_BASED_OUTPATIENT_CLINIC_OR_DEPARTMENT_OTHER): Payer: Self-pay | Admitting: Cardiology

## 2023-11-04 VITALS — BP 130/64 | HR 71 | Resp 17 | Ht 62.0 in | Wt 155.0 lb

## 2023-11-04 DIAGNOSIS — Z712 Person consulting for explanation of examination or test findings: Secondary | ICD-10-CM | POA: Diagnosis not present

## 2023-11-04 DIAGNOSIS — E7800 Pure hypercholesterolemia, unspecified: Secondary | ICD-10-CM

## 2023-11-04 DIAGNOSIS — I700 Atherosclerosis of aorta: Secondary | ICD-10-CM

## 2023-11-04 DIAGNOSIS — I10 Essential (primary) hypertension: Secondary | ICD-10-CM

## 2023-11-04 MED ORDER — ATORVASTATIN CALCIUM 20 MG PO TABS
20.0000 mg | ORAL_TABLET | Freq: Every day | ORAL | 3 refills | Status: DC
Start: 2023-11-04 — End: 2023-11-10

## 2023-11-04 NOTE — Patient Instructions (Addendum)
 Medication Instructions:  Your physician has recommended you make the following change in your medication:  1.) increase atorvastatin  20 mg - one tablet daily  *If you need a refill on your cardiac medications before your next appointment, please call your pharmacy*  Lab Work: Return in 2 months for blood work (lipid panel)   Testing/Procedures: none  Follow-Up: At [facility], you and your health needs are our priority.  As part of our continuing mission to provide you with exceptional heart care, our providers are all part of one team.  This team includes your primary Cardiologist (physician) and Advanced Practice Providers or APPs (Physician Assistants and Nurse Practitioners) who all work together to provide you with the care you need, when you need it.  Your next appointment:   12 month(s)  Provider:   Shelda Bruckner, MD, Rosaline Bane, NP, or Reche Finder, NP

## 2023-11-05 ENCOUNTER — Encounter: Payer: Self-pay | Admitting: Nurse Practitioner

## 2023-11-05 ENCOUNTER — Other Ambulatory Visit: Payer: Self-pay | Admitting: Family

## 2023-11-05 DIAGNOSIS — H9312 Tinnitus, left ear: Secondary | ICD-10-CM

## 2023-11-07 ENCOUNTER — Other Ambulatory Visit: Payer: Self-pay | Admitting: Nurse Practitioner

## 2023-11-08 ENCOUNTER — Encounter (HOSPITAL_BASED_OUTPATIENT_CLINIC_OR_DEPARTMENT_OTHER): Payer: Self-pay

## 2023-11-09 NOTE — Telephone Encounter (Signed)
 Requesting: VALSARTAN  160 MG TABLET , SERTRALINE  HCL 100 MG TABLET  Last Visit: 09/28/2023 Next Visit: 11/24/2023 Last Refill: 08/09/2023  Please Advise

## 2023-11-10 MED ORDER — ATORVASTATIN CALCIUM 10 MG PO TABS: 10.0000 mg | ORAL_TABLET | Freq: Every day | ORAL | 3 refills | Status: AC

## 2023-11-12 ENCOUNTER — Encounter (HOSPITAL_BASED_OUTPATIENT_CLINIC_OR_DEPARTMENT_OTHER): Payer: Self-pay

## 2023-11-24 ENCOUNTER — Encounter: Admitting: Nurse Practitioner

## 2023-12-03 ENCOUNTER — Ambulatory Visit (INDEPENDENT_AMBULATORY_CARE_PROVIDER_SITE_OTHER): Admitting: Nurse Practitioner

## 2023-12-03 ENCOUNTER — Ambulatory Visit: Payer: Self-pay | Admitting: Nurse Practitioner

## 2023-12-03 ENCOUNTER — Encounter: Payer: Self-pay | Admitting: Nurse Practitioner

## 2023-12-03 VITALS — BP 128/74 | HR 68 | Temp 97.3°F | Ht 62.0 in | Wt 156.0 lb

## 2023-12-03 DIAGNOSIS — K449 Diaphragmatic hernia without obstruction or gangrene: Secondary | ICD-10-CM

## 2023-12-03 DIAGNOSIS — M1990 Unspecified osteoarthritis, unspecified site: Secondary | ICD-10-CM | POA: Insufficient documentation

## 2023-12-03 DIAGNOSIS — E785 Hyperlipidemia, unspecified: Secondary | ICD-10-CM

## 2023-12-03 DIAGNOSIS — F419 Anxiety disorder, unspecified: Secondary | ICD-10-CM

## 2023-12-03 DIAGNOSIS — Z0000 Encounter for general adult medical examination without abnormal findings: Secondary | ICD-10-CM | POA: Insufficient documentation

## 2023-12-03 DIAGNOSIS — D510 Vitamin B12 deficiency anemia due to intrinsic factor deficiency: Secondary | ICD-10-CM

## 2023-12-03 DIAGNOSIS — R7303 Prediabetes: Secondary | ICD-10-CM | POA: Insufficient documentation

## 2023-12-03 DIAGNOSIS — M8580 Other specified disorders of bone density and structure, unspecified site: Secondary | ICD-10-CM

## 2023-12-03 DIAGNOSIS — I10 Essential (primary) hypertension: Secondary | ICD-10-CM

## 2023-12-03 LAB — POCT GLYCOSYLATED HEMOGLOBIN (HGB A1C)
HbA1c POC (<> result, manual entry): 5.7 % (ref 4.0–5.6)
HbA1c, POC (controlled diabetic range): 5.7 % (ref 0.0–7.0)
HbA1c, POC (prediabetic range): 5.7 % (ref 5.7–6.4)
Hemoglobin A1C: 5.7 % — AB (ref 4.0–5.6)

## 2023-12-03 NOTE — Patient Instructions (Signed)
 It was great to see you!  Start stretches daily for your knees   You can also try turmeric or glucosamine chondroitin.   Keep up the great work!  Let's follow-up in 1 year, sooner if you have concerns.  If a referral was placed today, you will be contacted for an appointment. Please note that routine referrals can sometimes take up to 3-4 weeks to process. Please call our office if you haven't heard anything after this time frame.  Take care,  Tinnie Harada, NP

## 2023-12-03 NOTE — Progress Notes (Unsigned)
 BP 128/74 (BP Location: Left Arm, Patient Position: Sitting, Cuff Size: Normal)   Pulse 68   Temp (!) 97.3 F (36.3 C)   Ht 5' 2 (1.575 m)   Wt 156 lb (70.8 kg)   SpO2 96%   BMI 28.53 kg/m    Subjective:    Patient ID: Jade Williams, female    DOB: [birth year], 73 y.o.   MRN: [id number]  CC: Chief Complaint  Patient presents with   Abdominal Pain    Concerns with spot on right side of chest, bilateral knee pain    HPI: Jade Williams is a 73 y.o. female presenting on 12/03/2023 for comprehensive medical examination. Current medical complaints include:knee pain  She currently lives with: alone Menopausal Symptoms: no  Depression and Anxiety Screen done today and results listed below:     12/03/2023    1:25 PM 02/26/2023    1:40 PM 02/08/2023   10:42 AM 01/28/2023    3:08 PM 11/27/2022    3:34 PM  Depression screen PHQ 2/9  Decreased Interest 0 0 0 0 0  Down, Depressed, Hopeless 0 0 0 0 0  PHQ - 2 Score 0 0 0 0 0  Altered sleeping 0      Tired, decreased energy 0      Change in appetite 0      Feeling bad or failure about yourself  0      Trouble concentrating 0      Moving slowly or fidgety/restless 0      Suicidal thoughts 0      PHQ-9 Score 0      Difficult doing work/chores Not difficult at all          12/03/2023    1:25 PM  GAD 7 : Generalized Anxiety Score  Nervous, Anxious, on Edge 0  Control/stop worrying 0  Worry too much - different things 0  Trouble relaxing 0  Restless 0  Easily annoyed or irritable 0  Afraid - awful might happen 0  Total GAD 7 Score 0  Anxiety Difficulty Not difficult at all    The patient does not have a history of falls. I did not complete a risk assessment for falls. A plan of care for falls was not documented.   Past Medical History:  Past Medical History:  Diagnosis Date   Anxiety    Back pain    Barrett's esophagus 01/17/2021   Depression    GERD (gastroesophageal reflux disease)    Hyperlipidemia     Hypertension    Osteopenia 11/14/2019   T score -1.6   Pernicious anemia     Surgical History:  Past Surgical History:  Procedure Laterality Date   APPENDECTOMY     HEMORRHOID BANDING     TONSILLECTOMY     WISDOM TOOTH EXTRACTION      Medications:  Current Outpatient Medications on File Prior to Visit  Medication Sig   albuterol  (VENTOLIN  HFA) 108 (90 Base) MCG/ACT inhaler Inhale 2 puffs into the lungs every 6 (six) hours as needed for wheezing or shortness of breath.   ascorbic acid (VITAMIN C) 500 MG tablet Take 500 mg by mouth daily.   atorvastatin  (LIPITOR) 10 MG tablet Take 1 tablet (10 mg total) by mouth daily.   b complex vitamins capsule Take 1 capsule by mouth daily.   Bioflavonoid Products (VITAMIN C) CHEW Chew by mouth.   calcium  carbonate (OS-CAL - DOSED IN MG OF ELEMENTAL CALCIUM ) 1250 (500 Ca) MG tablet Take  2 tablets by mouth daily.   cholecalciferol (VITAMIN D) 1000 UNITS tablet Take 5,000 Units by mouth daily.    Coenzyme Q10 (CO Q 10 PO) Take by mouth.   COLLAGEN MATRIX, BOVINE, EX Apply topically.   cyanocobalamin  (VITAMIN B12) 1000 MCG/ML injection INJECT 1 ML INTRAMUSCULARLY EVERY 30 DAYS   Docosahexaenoic Acid (DHA OMEGA 3 PO) Take 1 capsule by mouth daily.   loratadine (CLARITIN) 10 MG tablet Take 10 mg by mouth as needed.   MAGNESIUM CARBONATE PO Take by mouth.   Multiple Vitamins-Minerals (ICAPS AREDS 2 PO) Take 1 capsule by mouth daily.   NON FORMULARY Vitamin D and K   sertraline  (ZOLOFT ) 100 MG tablet TAKE 1 TABLET BY MOUTH EVERY DAY   valsartan  (DIOVAN ) 160 MG tablet TAKE 1 TABLET BY MOUTH EVERY DAY   vitamin E 400 UNIT capsule Take 400 Units by mouth daily.   fluconazole  (DIFLUCAN ) 150 MG tablet Take 150 mg by mouth once. (Patient not taking: Reported on 12/03/2023)   No current facility-administered medications on file prior to visit.    Allergies:  Allergies  Allergen Reactions   Sulfa Antibiotics Other (See Comments)    Gave funny  feeling    Social History:  Social History   Socioeconomic History   Marital status: Single    Spouse name: Not on file   Number of children: Not on file   Years of education: Not on file   Highest education level: Bachelor's degree (e.g., BA, AB, BS)  Occupational History   Not on file  Tobacco Use   Smoking status: Never   Smokeless tobacco: Never  Vaping Use   Vaping status: Never Used  Substance and Sexual Activity   Alcohol  use: Not Currently   Drug use: No   Sexual activity: Not on file  Other Topics Concern   Not on file  Social History Narrative   Not on file   Social Drivers of Health   Financial Resource Strain: Low Risk  (09/27/2023)   Overall Financial Resource Strain (CARDIA)    Difficulty of Paying Living Expenses: Not hard at all  Food Insecurity: No Food Insecurity (09/27/2023)   Hunger Vital Sign    Worried About Running Out of Food in the Last Year: Never true    Ran Out of Food in the Last Year: Never true  Transportation Needs: No Transportation Needs (09/27/2023)   PRAPARE - Administrator, Civil Service (Medical): No    Lack of Transportation (Non-Medical): No  Physical Activity: Sufficiently Active (09/27/2023)   Exercise Vital Sign    Days of Exercise per Week: 7 days    Minutes of Exercise per Session: 60 min  Stress: No Stress Concern Present (09/27/2023)   [employer] of Occupational Health - Occupational Stress Questionnaire    Feeling of Stress: Not at all  Social Connections: Unknown (09/27/2023)   Social Connection and Isolation Panel    Frequency of Communication with Friends and Family: More than three times a week    Frequency of Social Gatherings with Friends and Family: Twice a week    Attends Religious Services: 1 to 4 times per year    Active Member of [employer] or Organizations: Not on file    Attends Banker Meetings: Not on file    Marital Status: Not on file  Intimate Partner Violence: Not At Risk  (11/27/2022)   Humiliation, Afraid, Rape, and Kick questionnaire    Fear of Current or Ex-Partner: No  Emotionally Abused: No    Physically Abused: No    Sexually Abused: No   Social History   Tobacco Use  Smoking Status Never  Smokeless Tobacco Never   Social History   Substance and Sexual Activity  Alcohol  Use Not Currently    Family History:  Family History  Problem Relation Age of Onset   Hyperlipidemia Mother    Hypertension Mother    Stroke Mother    Heart disease Mother    Macular degeneration Mother    Cancer Father        lung cancer   Cancer Sister        lung cancer   Colon cancer Neg Hx    Colon polyps Neg Hx    Esophageal cancer Neg Hx    Rectal cancer Neg Hx    Stomach cancer Neg Hx     Past medical history, surgical history, medications, allergies, family history and social history reviewed with patient today and changes made to appropriate areas of the chart.   ROS All other ROS negative except what is listed above and in the HPI.      Objective:    BP 128/74 (BP Location: Left Arm, Patient Position: Sitting, Cuff Size: Normal)   Pulse 68   Temp (!) 97.3 F (36.3 C)   Ht 5' 2 (1.575 m)   Wt 156 lb (70.8 kg)   SpO2 96%   BMI 28.53 kg/m   Wt Readings from Last 3 Encounters:  12/03/23 156 lb (70.8 kg)  11/04/23 155 lb (70.3 kg)  09/28/23 153 lb 3.2 oz (69.5 kg)    Physical Exam  Results for orders placed or performed in visit on 10/26/23  Lipid panel   Collection Time: 10/28/23  9:56 AM  Result Value Ref Range   Cholesterol, Total 195 100 - 199 mg/dL   Triglycerides 872 0 - 149 mg/dL   HDL 60 >60 mg/dL   VLDL Cholesterol Cal 22 5 - 40 mg/dL   LDL Chol Calc (NIH) 886 (H) 0 - 99 mg/dL   Chol/HDL Ratio 3.3 0.0 - 4.4 ratio  Comprehensive metabolic panel with GFR   Collection Time: 10/28/23  9:56 AM  Result Value Ref Range   Glucose 98 70 - 99 mg/dL   BUN 17 8 - 27 mg/dL   Creatinine, Ser 9.06 0.57 - 1.00 mg/dL   eGFR 64 >40  fO/fpw/8.26   BUN/Creatinine Ratio 18 12 - 28   Sodium 139 134 - 144 mmol/L   Potassium 5.3 (H) 3.5 - 5.2 mmol/L   Chloride 100 96 - 106 mmol/L   CO2 23 20 - 29 mmol/L   Calcium  9.6 8.7 - 10.3 mg/dL   Total Protein 6.7 6.0 - 8.5 g/dL   Albumin 4.4 3.8 - 4.8 g/dL   Globulin, Total 2.3 1.5 - 4.5 g/dL   Bilirubin Total 0.5 0.0 - 1.2 mg/dL   Alkaline Phosphatase 170 (H) 44 - 121 IU/L   AST 29 0 - 40 IU/L   ALT 24 0 - 32 IU/L      Assessment & Plan:   Problem List Items Addressed This Visit       Cardiovascular and Mediastinum   Primary hypertension   Relevant Orders   CBC with Differential/Platelet   Comprehensive metabolic panel with GFR     Respiratory   Large hiatal hernia     Musculoskeletal and Integument   Osteopenia   Arthritis     Other   Anxiety  Hyperlipidemia   Relevant Orders   CBC with Differential/Platelet   Comprehensive metabolic panel with GFR   Lipid panel   Pernicious anemia   Relevant Orders   Vitamin B12   Prediabetes   Relevant Orders   Hemoglobin A1c   Routine general medical examination at a health care facility - Primary     Follow up plan: No follow-ups on file.   LABORATORY TESTING:  - Pap smear: not applicable  IMMUNIZATIONS:   - Tdap: Tetanus vaccination status reviewed: will get at pharmacy. - Influenza: Declined - Pneumovax: Up to date - Prevnar: Up to date - HPV: Not applicable - Shingrix vaccine: Declined  SCREENING: -Mammogram: Up to date  - Colonoscopy: Up to date  - Bone Density: ordered last visit   PATIENT COUNSELING:   Advised to take 1 mg of folate supplement per day if capable of pregnancy.   Sexuality: Discussed sexually transmitted diseases, partner selection, use of condoms, avoidance of unintended pregnancy  and contraceptive alternatives.   Advised to avoid cigarette smoking.  I discussed with the patient that most people either abstain from alcohol  or drink within safe limits (<=14/week and <=4  drinks/occasion for males, <=7/weeks and <= 3 drinks/occasion for females) and that the risk for alcohol  disorders and other health effects rises proportionally with the number of drinks per week and how often a drinker exceeds daily limits.  Discussed cessation/primary prevention of drug use and availability of treatment for abuse.   Diet: Encouraged to adjust caloric intake to maintain  or achieve ideal body weight, to reduce intake of dietary saturated fat and total fat, to limit sodium intake by avoiding high sodium foods and not adding table salt, and to maintain adequate dietary potassium and calcium  preferably from fresh fruits, vegetables, and low-fat dairy products.    stressed the importance of regular exercise  Injury prevention: Discussed safety belts, safety helmets, smoke detector, smoking near bedding or upholstery.   Dental health: Discussed importance of regular tooth brushing, flossing, and dental visits.    NEXT PREVENTATIVE PHYSICAL DUE IN 1 YEAR. No follow-ups on file.  Donnesha Karg A Tecora Eustache

## 2023-12-04 NOTE — Assessment & Plan Note (Signed)
 Chronic bilateral knee pain due to osteoarthritis is managed with exercise, as she prefers this over injections or orthopedic referral. Discussed exercises and potential supplements. Encourage regular strength exercises for her knees. Consider topical treatments like Voltaren gel and supplements such as turmeric or glucosamine chondroitin. Reassess if pain worsens or if she desires injection therapy.

## 2023-12-04 NOTE — Assessment & Plan Note (Signed)
 Chronic, stable. She is taking calcium  and vitamin D supplement daily. Follow-up dexa scan ordered today.

## 2023-12-04 NOTE — Assessment & Plan Note (Signed)
 Chronic, stable. Currently asymptomatic and managed with a low acid diet.

## 2023-12-04 NOTE — Assessment & Plan Note (Signed)
 Taking monthly vitamin B12 injections at home.

## 2023-12-04 NOTE — Assessment & Plan Note (Signed)
 Hyperlipidemia is managed with atorvastatin  10mg  daily, but increased dosage causes depression and anxiety. She is currently using psyllium husk as an alternative. Continue the current atorvastatin  dosage and psyllium husk supplementation. Encourage dietary modifications to reduce cholesterol intake.

## 2023-12-04 NOTE — Assessment & Plan Note (Signed)
 Chronic, stable. Continue valsartan  40mg  daily. Recent labs reviewed.

## 2023-12-04 NOTE — Assessment & Plan Note (Signed)
 Chronic, stable. A1c is 5.7% today. Encourage continued dietary management and exercise.

## 2023-12-04 NOTE — Assessment & Plan Note (Signed)
Chronic, stable. Continue sertraline 100mg daily.  

## 2023-12-04 NOTE — Assessment & Plan Note (Signed)
 Health maintenance reviewed and updated. Discussed nutrition, exercise. Follow-up 1 year.

## 2023-12-14 ENCOUNTER — Encounter: Payer: Self-pay | Admitting: *Deleted

## 2023-12-16 ENCOUNTER — Encounter: Payer: Self-pay | Admitting: Nurse Practitioner

## 2023-12-17 ENCOUNTER — Ambulatory Visit: Admitting: Family Medicine

## 2023-12-17 ENCOUNTER — Encounter: Payer: Self-pay | Admitting: Nurse Practitioner

## 2023-12-17 ENCOUNTER — Ambulatory Visit: Admitting: Nurse Practitioner

## 2023-12-17 ENCOUNTER — Ambulatory Visit: Payer: Self-pay

## 2023-12-17 VITALS — BP 124/72 | HR 74 | Temp 99.2°F | Ht 62.0 in

## 2023-12-17 DIAGNOSIS — J101 Influenza due to other identified influenza virus with other respiratory manifestations: Secondary | ICD-10-CM | POA: Diagnosis not present

## 2023-12-17 DIAGNOSIS — R059 Cough, unspecified: Secondary | ICD-10-CM

## 2023-12-17 DIAGNOSIS — R062 Wheezing: Secondary | ICD-10-CM | POA: Diagnosis not present

## 2023-12-17 LAB — POCT INFLUENZA A/B
Influenza A, POC: POSITIVE — AB
Influenza B, POC: NEGATIVE

## 2023-12-17 LAB — POC COVID19 BINAXNOW: SARS Coronavirus 2 Ag: NEGATIVE

## 2023-12-17 MED ORDER — METHYLPREDNISOLONE SODIUM SUCC 40 MG IJ SOLR
40.0000 mg | Freq: Once | INTRAMUSCULAR | Status: AC
  Administered 2023-12-17: 40 mg via INTRAMUSCULAR

## 2023-12-17 MED ORDER — BENZONATATE 100 MG PO CAPS: 100.0000 mg | ORAL_CAPSULE | Freq: Three times a day (TID) | ORAL | 0 refills | Status: AC | PRN

## 2023-12-17 NOTE — Progress Notes (Signed)
 Acute Office Visit  Subjective:     Patient ID: Jade Williams, female    DOB: 13-Feb-1951, 73 y.o.   MRN: [id number]  Chief Complaint  Patient presents with   Cough    For 5 days, sinus drainage, hurts in chest from cough, sinus headache, feels exhausted    HPI Discussed the use of AI scribe software for clinical note transcription with the patient, who gave verbal consent to proceed.  History of Present Illness   Jade Williams is a 73 year old female who presents with flu-like symptoms including muscle aches, sinus headache, and congestion.  She has experienced muscle aches, a persistent sinus headache, congestion, stuffy and runny nose, and ear pain for the past five days. There is no fever. Shortness of breath and chest tightness occur, especially when coughing, and are somewhat alleviated by an inhaler used since last December. She feels exhausted and describes difficulty with daily activities, such as walking her dog.  For symptom management, she uses acetaminophen, saline nasal spray, and a saline solution with antiviral properties. Tussin DM has provided some relief. She experiences a sore throat, particularly on the left side, and has a decreased appetite. Drinking water does not relieve the throat discomfort.  She participated in an all-day marching band competition last Saturday and is unaware of any sick contacts. She is concerned about her energy levels and the impact on her daily activities.     ROS See pertinent positives and negatives per HPI.     Objective:    BP 124/72 (BP Location: Left Arm, Patient Position: Sitting, Cuff Size: Normal)   Pulse 74   Temp 99.2 F (37.3 C) (Oral)   Ht 5' 2 (1.575 m)   SpO2 97%   BMI 28.53 kg/m    Physical Exam Vitals and nursing note reviewed.  Constitutional:      General: She is not in acute distress.    Appearance: Normal appearance.  HENT:     Head: Normocephalic.     Right Ear: Tympanic membrane, ear  canal and external ear normal.     Left Ear: Tympanic membrane, ear canal and external ear normal.     Nose: Congestion present.     Right Sinus: No maxillary sinus tenderness or frontal sinus tenderness.     Left Sinus: No maxillary sinus tenderness or frontal sinus tenderness.     Mouth/Throat:     Pharynx: Posterior oropharyngeal erythema present. No oropharyngeal exudate.  Eyes:     Conjunctiva/sclera: Conjunctivae normal.  Cardiovascular:     Rate and Rhythm: Normal rate and regular rhythm.     Pulses: Normal pulses.     Heart sounds: Normal heart sounds.  Pulmonary:     Effort: Pulmonary effort is normal.     Breath sounds: Normal breath sounds.     Comments: Coughing during visit Musculoskeletal:     Cervical back: Normal range of motion and neck supple. No tenderness.  Lymphadenopathy:     Cervical: No cervical adenopathy.  Skin:    General: Skin is warm.  Neurological:     General: No focal deficit present.     Mental Status: She is alert and oriented to person, place, and time.  Psychiatric:        Mood and Affect: Mood normal.        Behavior: Behavior normal.        Thought Content: Thought content normal.        Judgment: Judgment normal.  Results for orders placed or performed in visit on 12/17/23  POCT Influenza A/B  Result Value Ref Range   Influenza A, POC Positive (A) Negative   Influenza B, POC Negative Negative  POC COVID-19  Result Value Ref Range   SARS Coronavirus 2 Ag Negative Negative        Assessment & Plan:   Problem List Items Addressed This Visit       Respiratory   Influenza A - Primary   Influenza presents with muscle aches, severe sinus headache, congestion, rhinorrhea, ear pain, and dyspnea. Wheezing and dyspnea are noted. Antivirals are not an option due to timing. Administer solumedrol 40mg  IM for shortness of breath and wheezing. Prescribe Tessalon  Pearls for cough TID prn, with caution for drowsiness. Advise rest and  increased fluid intake. Instruct to monitor symptoms and report any worsening of breathing.       Other Visit Diagnoses       Cough in adult       Covid-19 negative, flu A positive. Start tessalon  perles 100mg  TID prn cough. Encourage fluids, rest.   Relevant Orders   POCT Influenza A/B (Completed)   POC COVID-19 (Completed)     Wheezing       Continue albuterol  inhaler every 6 hours as needed for shortness of breath and wheezing. Solumedrol 40mg  IM given in office today.   Relevant Medications   methylPREDNISolone  sodium succinate (SOLU-MEDROL ) 40 mg/mL injection 40 mg (Completed)       Meds ordered this encounter  Medications   methylPREDNISolone  sodium succinate (SOLU-MEDROL ) 40 mg/mL injection 40 mg   benzonatate  (TESSALON ) 100 MG capsule    Sig: Take 1 capsule (100 mg total) by mouth 3 (three) times daily as needed for cough.    Dispense:  30 capsule    Refill:  0    Return if symptoms worsen or fail to improve.  Tinnie DELENA Harada, NP

## 2023-12-17 NOTE — Telephone Encounter (Signed)
 Noted. Patient seen today, 12/17/23.

## 2023-12-17 NOTE — Telephone Encounter (Signed)
 FYI Only or Action Required?: Action required by provider: request for appointment.  Patient was last seen in primary care on 12/03/2023 by Nedra Tinnie LABOR, NP.  Called Nurse Triage reporting Influenza.  Symptoms began yesterday.  Interventions attempted: Rest, hydration, or home remedies.  Symptoms are: gradually worsening. Pt. States she feels like she has the flu.   Triage Disposition: See HCP Within 4 Hours (Or PCP Triage)  Patient/caregiver understands and will follow disposition?: Yes     Copied from CRM #[id number]. Topic: Clinical - Red Word Triage >> Dec 17, 2023  8:16 AM Suzen RAMAN wrote: Red Word that prompted transfer to Nurse Triage: headache, body ache upper respiratory. Requesting an appt Reason for Disposition  MILD difficulty breathing (e.g., minimal/no SOB at rest, SOB with walking, pulse < 100)  Answer Assessment - Initial Assessment Questions 1. SYMPTOMS: What is your main symptom or concern? (e.g., cough, fever, shortness of breath, muscle aches)     Cough, headache, body aches 2. ONSET: When did the symptoms start?      4-5 days ago 3. COUGH: Do you have a cough? If Yes, ask: How bad is the cough?       yes 4. FEVER: Do you have a fever? If Yes, ask: What is your temperature, how was it measured, and when did it start?     no 5. BREATHING DIFFICULTY: Are you having any difficulty breathing? (e.g., normal; shortness of breath, wheezing, unable to speak)      mild 6. BETTER-SAME-WORSE: Are you getting better, staying the same or getting worse compared to yesterday?  If getting worse, ask, In what way?     worse 7. OTHER SYMPTOMS: Do you have any other symptoms?  (e.g., chills, fatigue, headache, loss of smell or taste, muscle pain, sore throat)     no 8. INFLUENZA EXPOSURE: Was there any known exposure to influenza (flu) before the symptoms began?      no 9. INFLUENZA SUSPECTED: Why do you think you have influenza? (e.g., positive  flu self-test at home, symptoms after exposure).     yes 10. INFLUENZA VACCINE: Have you had the flu vaccine? If Yes, ask: When did you last get it?       N/a 11. HIGH RISK FOR COMPLICATIONS: Do you have any chronic medical problems? (e.g., asthma, heart or lung disease, obesity, weak immune system)       no 12. PREGNANCY: Is there any chance you are pregnant? When was your last menstrual period?       no 13. O2 SATURATION MONITOR:  Do you use an oxygen saturation monitor (pulse oximeter) at home? If Yes, ask What is your reading (oxygen level) today? What is your usual oxygen saturation reading? (e.g., 95%)       no  Protocols used: Influenza (Flu) Suspected-A-AH

## 2023-12-17 NOTE — Assessment & Plan Note (Signed)
 Influenza presents with muscle aches, severe sinus headache, congestion, rhinorrhea, ear pain, and dyspnea. Wheezing and dyspnea are noted. Antivirals are not an option due to timing. Administer solumedrol 40mg  IM for shortness of breath and wheezing. Prescribe Tessalon  Pearls for cough TID prn, with caution for drowsiness. Advise rest and increased fluid intake. Instruct to monitor symptoms and report any worsening of breathing.

## 2023-12-17 NOTE — Patient Instructions (Addendum)
 It was great to see you!  Drink plenty of fluids and get rest   We are giving you a steroid injection today to help with wheezing and headache   Keep taking over the counter medications   Start tessalon  perles 3 times a day as needed for cough   Let's follow-up if symptoms worsen or don't improve   Take care,  Tinnie Harada, NP

## 2023-12-20 ENCOUNTER — Encounter: Payer: Self-pay | Admitting: Nurse Practitioner

## 2023-12-20 ENCOUNTER — Ambulatory Visit: Payer: Self-pay

## 2023-12-20 MED ORDER — AZITHROMYCIN 250 MG PO TABS: ORAL_TABLET | ORAL | 0 refills | Status: AC

## 2023-12-20 NOTE — Telephone Encounter (Signed)
 FYI Only or Action Required?: Action required by provider: medication refill request. PT is requesting a z-pac be called in  Patient was last seen in primary care on 12/17/2023 by Nedra Tinnie LABOR, NP.  Called Nurse Triage reporting Wheezing. - wet cough  Symptoms began a week ago.  Interventions attempted: Other: seen in office.  Symptoms are: gradually worsening.  Triage Disposition: See HCP Within 4 Hours (Or PCP Triage)  Patient/caregiver understands and will follow disposition?: no - pt does not want to come in unless she must come in. Pt wants medication called in.                     Copied from CRM #[id number]. Topic: Clinical - Red Word Triage >> Dec 20, 2023  2:31 PM Dedra B wrote: [facility] that prompted transfer to Nurse Triage: Pt having wheezing (so loud when laying down), bad productive cough, and drainage. Warm transfer to NT. Reason for Disposition  [1] MILD difficulty breathing (e.g., minimal/no SOB at rest, SOB with walking, pulse < 100) AND [2] NEW-onset or WORSE than normal  Answer Assessment - Initial Assessment Questions 1. RESPIRATORY STATUS: Describe your breathing? (e.g., wheezing, shortness of breath, unable to speak, severe coughing)      Wheezing 2. ONSET: When did this breathing problem begin?      Weds last week 3. PATTERN Does the difficult breathing come and go, or has it been constant since it started?      constant 4. SEVERITY: How bad is your breathing? (e.g., mild, moderate, severe)      mild 5. RECURRENT SYMPTOM: Have you had difficulty breathing before? If Yes, ask: When was the last time? and What happened that time?      yes  8. CAUSE: What do you think is causing the breathing problem?      Flu 9. OTHER SYMPTOMS: Do you have any other symptoms? (e.g., chest pain, cough, dizziness, fever, runny nose)     flu  Protocols used: Breathing Difficulty-A-AH

## 2023-12-20 NOTE — Telephone Encounter (Signed)
 Pt sent in a MyChart message today and has already been contacted regarding the prescriptions that Lauren sent in today. Closing this encounter.

## 2024-01-04 ENCOUNTER — Encounter: Payer: Self-pay | Admitting: Nurse Practitioner

## 2024-01-21 ENCOUNTER — Ambulatory Visit

## 2024-01-21 VITALS — Ht 62.25 in | Wt 145.0 lb

## 2024-01-21 DIAGNOSIS — Z0000 Encounter for general adult medical examination without abnormal findings: Secondary | ICD-10-CM

## 2024-01-21 NOTE — Patient Instructions (Signed)
 Jade Williams,  Thank you for taking the time for your Medicare Wellness Visit. I appreciate your continued commitment to your health goals. Please review the care plan we discussed, and feel free to reach out if I can assist you further.  Please note that Annual Wellness Visits do not include a physical exam. Some assessments may be limited, especially if the visit was conducted virtually. If needed, we may recommend an in-person follow-up with your provider.  Ongoing Care Seeing your primary care provider every 3 to 6 months helps us  monitor your health and provide consistent, personalized care.   Referrals If a referral was made during today's visit and you haven't received any updates within two weeks, please contact the referred provider directly to check on the status.  Recommended Screenings:  Health Maintenance  Topic Date Due   DTaP/Tdap/Td vaccine (2 - Td or Tdap) 02/01/2023   COVID-19 Vaccine (8 - 2025-26 season) 10/18/2023   Medicare Annual Wellness Visit  11/27/2023   Hepatitis C Screening  09/27/2024*   Colon Cancer Screening  12/25/2025   Flu Shot  Completed   Osteoporosis screening with Bone Density Scan  Completed   Meningitis B Vaccine  Aged Out   Pneumococcal Vaccine for age over 41  Discontinued   Breast Cancer Screening  Discontinued   Zoster (Shingles) Vaccine  Discontinued  *Topic was postponed. The date shown is not the original due date.       01/21/2024   10:47 AM  Advanced Directives  Does Patient Have a Medical Advance Directive? Yes  Type of Estate Agent of [place];Out of facility DNR (pink MOST or yellow form)  Copy of Healthcare Power of Attorney in Chart? No - copy requested    Vision: Annual vision screenings are recommended for early detection of glaucoma, cataracts, and diabetic retinopathy. These exams can also reveal signs of chronic conditions such as diabetes and high blood pressure.  Dental: Annual dental  screenings help detect early signs of oral cancer, gum disease, and other conditions linked to overall health, including heart disease and diabetes.  Please see the attached documents for additional preventive care recommendations.

## 2024-01-21 NOTE — Progress Notes (Signed)
 Chief Complaint  Patient presents with   Medicare Wellness     Subjective:   Jade Williams is a 73 y.o. female who presents for a Medicare Annual Wellness Visit.  Visit info / Clinical Intake: Medicare Wellness Visit Type:: Subsequent Annual Wellness Visit Persons participating in visit and providing information:: patient Medicare Wellness Visit Mode:: Video Since this visit was completed virtually, some vitals may be partially provided or unavailable. Missing vitals are due to the limitations of the virtual format.: Documented vitals are patient reported If Telephone or Video please confirm:: I connected with patient using audio/video enable telemedicine. I verified patient identity with two identifiers, discussed telehealth limitations, and patient agreed to proceed. Patient Location:: home Provider Location:: office Interpreter Needed?: No Pre-visit prep was completed: yes AWV questionnaire completed by patient prior to visit?: no Living arrangements:: (!) lives alone Patient's Overall Health Status Rating: excellent Typical amount of pain: some Does pain affect daily life?: no Are you currently prescribed opioids?: no  Dietary Habits and Nutritional Risks How many meals a day?: 3 Eats fruit and vegetables daily?: yes Most meals are obtained by: preparing own meals In the last 2 weeks, have you had any of the following?: none Diabetic:: no  Functional Status Activities of Daily Living (to include ambulation/medication): Independent Ambulation: Independent Medication Administration: Independent Home Management (perform basic housework or laundry): Independent Manage your own finances?: yes Primary transportation is: driving Concerns about vision?: no *vision screening is required for WTM* Concerns about hearing?: no  Fall Screening Falls in the past year?: 0 Number of falls in past year: 0 Was there an injury with Fall?: 0 Fall Risk Category Calculator:  0 Patient Fall Risk Level: Low Fall Risk  Fall Risk Patient at Risk for Falls Due to: Medication side effect Fall risk Follow up: Falls prevention discussed; Falls evaluation completed  Home and Transportation Safety: All rugs have non-skid backing?: N/A, no rugs All stairs or steps have railings?: N/A, no stairs Grab bars in the bathtub or shower?: (!) no Have non-skid surface in bathtub or shower?: yes Good home lighting?: yes Regular seat belt use?: yes Hospital stays in the last year:: no  Cognitive Assessment Difficulty concentrating, remembering, or making decisions? : no Will 6CIT or Mini Cog be Completed: no 6CIT or Mini Cog Declined: patient alert, oriented, able to answer questions appropriately and recall recent events  Advance Directives (For Healthcare) Does Patient Have a Medical Advance Directive?: Yes Type of Advance Directive: Healthcare Power of [place]; Out of facility DNR (pink MOST or yellow form) Copy of Healthcare Power of Attorney in Chart?: No - copy requested Out of facility DNR (pink MOST or yellow form) in Chart? (Ambulatory ONLY): No - copy requested  Reviewed/Updated  Reviewed/Updated: Reviewed All (Medical, Surgical, Family, Medications, Allergies, Care Teams, Patient Goals)    Allergies (verified) Sulfa antibiotics   Current Medications (verified) Outpatient Encounter Medications as of 01/21/2024  Medication Sig   albuterol  (VENTOLIN  HFA) 108 (90 Base) MCG/ACT inhaler Inhale 2 puffs into the lungs every 6 (six) hours as needed for wheezing or shortness of breath.   ascorbic acid (VITAMIN C) 500 MG tablet Take 500 mg by mouth daily.   atorvastatin  (LIPITOR) 10 MG tablet Take 1 tablet (10 mg total) by mouth daily.   b complex vitamins capsule Take 1 capsule by mouth daily.   benzonatate  (TESSALON ) 100 MG capsule Take 1 capsule (100 mg total) by mouth 3 (three) times daily as needed for cough.   Bioflavonoid  Products (VITAMIN C) CHEW Chew by  mouth.   calcium  carbonate (OS-CAL - DOSED IN MG OF ELEMENTAL CALCIUM ) 1250 (500 Ca) MG tablet Take 2 tablets by mouth daily.   cholecalciferol (VITAMIN D) 1000 UNITS tablet Take 5,000 Units by mouth daily.    Coenzyme Q10 (CO Q 10 PO) Take by mouth.   COLLAGEN MATRIX, BOVINE, EX Apply topically.   cyanocobalamin  (VITAMIN B12) 1000 MCG/ML injection INJECT 1 ML INTRAMUSCULARLY EVERY 30 DAYS   Docosahexaenoic Acid (DHA OMEGA 3 PO) Take 1 capsule by mouth daily.   loratadine (CLARITIN) 10 MG tablet Take 10 mg by mouth as needed.   MAGNESIUM CARBONATE PO Take by mouth.   Multiple Vitamins-Minerals (ICAPS AREDS 2 PO) Take 1 capsule by mouth daily.   NON FORMULARY Vitamin D and K   PSYLLIUM HUSK PO Take by mouth.   sertraline  (ZOLOFT ) 100 MG tablet TAKE 1 TABLET BY MOUTH EVERY DAY   valsartan  (DIOVAN ) 160 MG tablet TAKE 1 TABLET BY MOUTH EVERY DAY   vitamin E 400 UNIT capsule Take 400 Units by mouth daily.   fluconazole  (DIFLUCAN ) 150 MG tablet Take 150 mg by mouth once. (Patient not taking: Reported on 01/21/2024)   No facility-administered encounter medications on file as of 01/21/2024.    History: Past Medical History:  Diagnosis Date   Anxiety    Back pain    Barrett's esophagus 01/17/2021   Depression    GERD (gastroesophageal reflux disease)    Hyperlipidemia    Hypertension    Osteopenia 11/14/2019   T score -1.6   Pernicious anemia    Past Surgical History:  Procedure Laterality Date   APPENDECTOMY     HEMORRHOID BANDING     TONSILLECTOMY     WISDOM TOOTH EXTRACTION     Family History  Problem Relation Age of Onset   Hyperlipidemia Mother    Hypertension Mother    Stroke Mother    Heart disease Mother    Macular degeneration Mother    Cancer Father        lung cancer   Cancer Sister        lung cancer   Colon cancer Neg Hx    Colon polyps Neg Hx    Esophageal cancer Neg Hx    Rectal cancer Neg Hx    Stomach cancer Neg Hx    Social History   Occupational  History   Not on file  Tobacco Use   Smoking status: Never   Smokeless tobacco: Never  Vaping Use   Vaping status: Never Used  Substance and Sexual Activity   Alcohol  use: Not Currently   Drug use: No   Sexual activity: Not on file   Tobacco Counseling Counseling given: Not Answered  SDOH Screenings   Food Insecurity: No Food Insecurity (01/21/2024)  Housing: Unknown (01/21/2024)  Transportation Needs: No Transportation Needs (01/21/2024)  Utilities: Not At Risk (01/21/2024)  Alcohol  Screen: Low Risk  (01/21/2024)  Depression (PHQ2-9): Low Risk  (01/21/2024)  Financial Resource Strain: Low Risk  (01/21/2024)  Physical Activity: Sufficiently Active (01/21/2024)  Social Connections: Moderately Isolated (01/21/2024)  Stress: No Stress Concern Present (01/21/2024)  Tobacco Use: Low Risk  (01/21/2024)  Health Literacy: Adequate Health Literacy (01/21/2024)   See flowsheets for full screening details  Depression Screen PHQ 2 & 9 Depression Scale- Over the past 2 weeks, how often have you been bothered by any of the following problems? Little interest or pleasure in doing things: 0 Feeling down, depressed, or  hopeless (PHQ Adolescent also includes...irritable): 0 PHQ-2 Total Score: 0 Trouble falling or staying asleep, or sleeping too much: 0 Feeling tired or having little energy: 0 Poor appetite or overeating (PHQ Adolescent also includes...weight loss): 0 Feeling bad about yourself - or that you are a failure or have let yourself or your family down: 0 Trouble concentrating on things, such as reading the newspaper or watching television (PHQ Adolescent also includes...like school work): 0 Moving or speaking so slowly that other people could have noticed. Or the opposite - being so fidgety or restless that you have been moving around a lot more than usual: 0 Thoughts that you would be better off dead, or of hurting yourself in some way: 0 PHQ-9 Total Score: 0 If you checked off any  problems, how difficult have these problems made it for you to do your work, take care of things at home, or get along with other people?: Not difficult at all     Goals Addressed             This Visit's Progress    Patient Stated       01/21/2024, wants to lose a little more weight and continue with no sugar             Objective:    Today's Vitals   01/21/24 1041  Weight: 145 lb (65.8 kg)  Height: 5' 2.25 (1.581 m)   Body mass index is 26.31 kg/m.  Hearing/Vision screen Hearing Screening - Comments:: Denies hearing issues Vision Screening - Comments:: Regular eye exams, [place] Immunizations and Health Maintenance Health Maintenance  Topic Date Due   Hepatitis C Screening  09/27/2024 (Originally 10/30/1968)   COVID-19 Vaccine (9 - 2025-26 season) 07/05/2024   Medicare Annual Wellness (AWV)  01/20/2025   Colonoscopy  12/25/2025   DTaP/Tdap/Td (3 - Td or Tdap) 01/05/2034   Influenza Vaccine  Completed   Bone Density Scan  Completed   Meningococcal B Vaccine  Aged Out   Pneumococcal Vaccine: 50+ Years  Discontinued   Mammogram  Discontinued   Zoster Vaccines- Shingrix  Discontinued        Assessment/Plan:  This is a routine wellness examination for [place].  Patient Care Team: Nedra Tinnie LABOR, NP as PCP - General (Internal Medicine) Lonni Slain, MD as PCP - Cardiology (Cardiology)  I have personally reviewed and noted the following in the patient's chart:   Medical and social history Use of alcohol , tobacco or illicit drugs  Current medications and supplements including opioid prescriptions. Functional ability and status Nutritional status Physical activity Advanced directives List of other physicians Hospitalizations, surgeries, and ER visits in previous 12 months Vitals Screenings to include cognitive, depression, and falls Referrals and appointments  No orders of the defined types were placed in this encounter.  In  addition, I have reviewed and discussed with patient certain preventive protocols, quality metrics, and best practice recommendations. A written personalized care plan for preventive services as well as general preventive health recommendations were provided to patient.   Ardella FORBES Dawn, LPN   [id number]/05/7972   Return in 1 year (on 01/20/2025).  After Visit Summary: (MyChart) Due to this being a telephonic visit, the after visit summary with patients personalized plan was offered to patient via MyChart   Nurse Notes: none

## 2024-02-13 ENCOUNTER — Other Ambulatory Visit: Payer: Self-pay | Admitting: Nurse Practitioner

## 2024-02-15 NOTE — Telephone Encounter (Signed)
 Requesting: SERTRALINE  HCL 100 MG TABLET , VALSARTAN  160 MG TABLET  Last Visit: 12/17/2023 Next Visit: Visit date not found Last Refill: 11/09/2023  Please Advise

## 2024-02-23 ENCOUNTER — Encounter: Payer: Self-pay | Admitting: Gastroenterology

## 2024-02-23 ENCOUNTER — Ambulatory Visit: Admitting: Gastroenterology

## 2024-02-23 VITALS — BP 126/64 | HR 73 | Ht 62.25 in | Wt 149.2 lb

## 2024-02-23 DIAGNOSIS — R1319 Other dysphagia: Secondary | ICD-10-CM

## 2024-02-23 DIAGNOSIS — K22719 Barrett's esophagus with dysplasia, unspecified: Secondary | ICD-10-CM

## 2024-02-23 DIAGNOSIS — K2270 Barrett's esophagus without dysplasia: Secondary | ICD-10-CM

## 2024-02-23 DIAGNOSIS — R1310 Dysphagia, unspecified: Secondary | ICD-10-CM

## 2024-02-23 DIAGNOSIS — K31A0 Gastric intestinal metaplasia, unspecified: Secondary | ICD-10-CM | POA: Diagnosis not present

## 2024-02-23 DIAGNOSIS — Z1211 Encounter for screening for malignant neoplasm of colon: Secondary | ICD-10-CM

## 2024-02-23 NOTE — Patient Instructions (Signed)
 You have been scheduled for an endoscopy. Please follow written instructions given to you at your visit today.  If you use inhalers (even only as needed), please bring them with you on the day of your procedure.  If you take any of the following medications, they will need to be adjusted prior to your procedure:   DO NOT TAKE 7 DAYS PRIOR TO TEST- Trulicity (dulaglutide) Ozempic, Wegovy (semaglutide) Mounjaro, Zepbound (tirzepatide) Bydureon Bcise (exanatide extended release)  DO NOT TAKE 1 DAY PRIOR TO YOUR TEST Rybelsus (semaglutide) Adlyxin (lixisenatide) Victoza (liraglutide) Byetta (exanatide) ___________________________________________________________________________  _______________________________________________________  If your blood pressure at your visit was 140/90 or greater, please contact your primary care physician to follow up on this.  _______________________________________________________  If you are age 36 or older, your body mass index should be between 23-30. Your Body mass index is 27.08 kg/m. If this is out of the aforementioned range listed, please consider follow up with your Primary Care Provider.  If you are age 60 or younger, your body mass index should be between 19-25. Your Body mass index is 27.08 kg/m. If this is out of the aformentioned range listed, please consider follow up with your Primary Care Provider.   ________________________________________________________  The [facility] providers would like to encourage you to use MYCHART to communicate with providers for non-urgent requests or questions.  Due to long hold times on the telephone, sending your provider a message by [facility] may be a faster and more efficient way to get a response.  Please allow 48 business hours for a response.  Please remember that this is for non-urgent requests.  _______________________________________________________  [facility] is using a team-based  approach to care.  Your team is made up of your doctor and two to three APPS. Our APPS (Nurse Practitioners and Physician Assistants) work with your physician to ensure care continuity for you. They are fully qualified to address your health concerns and develop a treatment plan. They communicate directly with your gastroenterologist to care for you. Seeing the Advanced Practice Practitioners on your physician's team can help you by facilitating care more promptly, often allowing for earlier appointments, access to diagnostic testing, procedures, and other specialty referrals.   Due to recent changes in healthcare laws, you may see the results of your imaging and laboratory studies on MyChart before your provider has had a chance to review them.  We understand that in some cases there may be results that are confusing or concerning to you. Not all laboratory results come back in the same time frame and the provider may be waiting for multiple results in order to interpret others.  Please give us  48 hours in order for your provider to thoroughly review all the results before contacting the office for clarification of your results.   Thank you for choosing me and [facility].  Dr. Wilhelmenia

## 2024-02-23 NOTE — Progress Notes (Signed)
 "  GASTROENTEROLOGY OUTPATIENT CLINIC VISIT   Primary Care Provider Nedra Tinnie LABOR, NP [address] [place] [address] [phone]  Referring Provider Nedra Tinnie LABOR, NP [address] [place],  [place] [address] [phone]  Patient Profile: Jade Williams is a 74 y.o. female with a pmh significant for hypertension, hyperlipidemia, anxiety, osteopenia, gastric intestinal metaplasia, Barrett's esophagus.  The patient presents to the [facility] for an evaluation and management of problem(s) noted below:  Problem List 1. Esophageal dysphagia   2. Barrett's esophagus without dysplasia   3. Gastric intestinal metaplasia   4. Colon cancer screening    Discussed the use of AI scribe software for clinical note transcription with the patient, who gave verbal consent to proceed.  History of Present Illness Please see prior GI notes for full details of HPI.  Interval History Jade Williams is a 74 year old female with Barrett's esophagus, gastric intestinal metaplasia, and hiatal hernia who presents for evaluation and follow-up of intermittent esophageal dysphagia.  We last saw her in 2022 at time of EGD.  She was recommended after biopsies showed Barretts and GIM to follow within 6 months to do surveillance mapping.  She proceeded with trying to get acid-free.  She is not currently taking acid-reducing medications, preferring dietary modifications based on the drop acid diet. Most food is prepared at home, with rare consumption of pre-made foods. She prefers to avoid additional medications, if possible but understands after discussion role of acid suppression in both Barrett's and GIM.  Over the past 6-8 months, she has experienced intermittent episodes of food hanging or feeling stuck in the esophagus, particularly with thicker foods such as mashed potatoes and yogurt. These episodes are associated with excessive salivation and, on one  occasion regurgitation and vomiting.  Drinking water and eating more slowly have reduced the frequency of symptoms. The most recent episode occurred at Thanksgiving, No difficulty with liquids, though thicker shakes can take longer to pass. Burping sometimes relieves the sensation. No chest pain or discomfort reported.  Weight has remained stable overall with some intentional weight loss of 7 pounds recently.   GI Review of Systems Positive as above Negative for odynophagia, pain, melena, hematochezia   Review of Systems General: Denies fevers/chills/weight loss unintentionally Cardiovascular: Denies chest pain Pulmonary: Denies shortness of breath Gastroenterological: See HPI Genitourinary: Denies darkened urine Hematological: Denies easy bruising/bleeding Dermatological: Denies jaundice Psychological: Mood is stable  Medications Current Outpatient Medications  Medication Sig Dispense Refill   albuterol  (VENTOLIN  HFA) 108 (90 Base) MCG/ACT inhaler Inhale 2 puffs into the lungs every 6 (six) hours as needed for wheezing or shortness of breath. 18 g 1   ascorbic acid (VITAMIN C) 500 MG tablet Take 500 mg by mouth daily.     atorvastatin  (LIPITOR) 10 MG tablet Take 1 tablet (10 mg total) by mouth daily. 90 tablet 3   b complex vitamins capsule Take 1 capsule by mouth daily.     benzonatate  (TESSALON ) 100 MG capsule Take 1 capsule (100 mg total) by mouth 3 (three) times daily as needed for cough. 30 capsule 0   Bioflavonoid Products (VITAMIN C) CHEW Chew by mouth.     calcium  carbonate (OS-CAL - DOSED IN MG OF ELEMENTAL CALCIUM ) 1250 (500 Ca) MG tablet Take 2 tablets by mouth daily.     cholecalciferol (VITAMIN D) 1000 UNITS tablet Take 5,000 Units by mouth daily.      Coenzyme Q10 (CO Q 10 PO) Take by mouth.  COLLAGEN MATRIX, BOVINE, EX Apply topically.     cyanocobalamin  (VITAMIN B12) 1000 MCG/ML injection INJECT 1 ML INTRAMUSCULARLY EVERY 30 DAYS 3 mL 3   Docosahexaenoic Acid (DHA  OMEGA 3 PO) Take 1 capsule by mouth daily.     loratadine (CLARITIN) 10 MG tablet Take 10 mg by mouth as needed.     MAGNESIUM CARBONATE PO Take by mouth.     Multiple Vitamins-Minerals (ICAPS AREDS 2 PO) Take 1 capsule by mouth daily.     NON FORMULARY Vitamin D and K     PSYLLIUM HUSK PO Take by mouth.     sertraline  (ZOLOFT ) 100 MG tablet TAKE 1 TABLET BY MOUTH EVERY DAY 90 tablet 3   vitamin E 400 UNIT capsule Take 400 Units by mouth daily.     fluconazole  (DIFLUCAN ) 150 MG tablet Take 150 mg by mouth once. (Patient not taking: Reported on 02/23/2024)     valsartan  (DIOVAN ) 160 MG tablet TAKE 1 TABLET BY MOUTH EVERY DAY 90 tablet 3   No current facility-administered medications for this visit.    Allergies Allergies[1]  Histories Past Medical History:  Diagnosis Date   Anxiety    Back pain    Barrett's esophagus 01/17/2021   Depression    GERD (gastroesophageal reflux disease)    Hyperlipidemia    Hypertension    Osteopenia 11/14/2019   T score -1.6   Pernicious anemia    Past Surgical History:  Procedure Laterality Date   APPENDECTOMY     HEMORRHOID BANDING     TONSILLECTOMY     WISDOM TOOTH EXTRACTION     Social History   Socioeconomic History   Marital status: Single    Spouse name: Not on file   Number of children: Not on file   Years of education: Not on file   Highest education level: Bachelor's degree (e.g., BA, AB, BS)  Occupational History   Not on file  Tobacco Use   Smoking status: Never   Smokeless tobacco: Never  Vaping Use   Vaping status: Never Used  Substance and Sexual Activity   Alcohol  use: Not Currently   Drug use: No   Sexual activity: Not on file  Other Topics Concern   Not on file  Social History Narrative   Not on file   Social Drivers of Health   Tobacco Use: Low Risk (02/24/2024)   Patient History    Smoking Tobacco Use: Never    Smokeless Tobacco Use: Never    Passive Exposure: Not on file  Financial Resource Strain: Low  Risk (01/21/2024)   Overall Financial Resource Strain (CARDIA)    Difficulty of Paying Living Expenses: Not hard at all  Food Insecurity: No Food Insecurity (01/21/2024)   Epic    Worried About Radiation Protection Practitioner of Food in the Last Year: Never true    Ran Out of Food in the Last Year: Never true  Transportation Needs: No Transportation Needs (01/21/2024)   Epic    Lack of Transportation (Medical): No    Lack of Transportation (Non-Medical): No  Physical Activity: Sufficiently Active (01/21/2024)   Exercise Vital Sign    Days of Exercise per Week: 7 days    Minutes of Exercise per Session: 30 min  Stress: No Stress Concern Present (01/21/2024)   [employer] of Occupational Health - Occupational Stress Questionnaire    Feeling of Stress: Not at all  Social Connections: Moderately Isolated (01/21/2024)   Social Connection and Isolation Panel    Frequency  of Communication with Friends and Family: More than three times a week    Frequency of Social Gatherings with Friends and Family: More than three times a week    Attends Religious Services: Never    Database Administrator or Organizations: Yes    Attends Engineer, Structural: More than 4 times per year    Marital Status: Never married  Intimate Partner Violence: Not At Risk (01/21/2024)   Epic    Fear of Current or Ex-Partner: No    Emotionally Abused: No    Physically Abused: No    Sexually Abused: No  Depression (PHQ2-9): Low Risk (01/21/2024)   Depression (PHQ2-9)    PHQ-2 Score: 0  Alcohol  Screen: Low Risk (01/21/2024)   Alcohol  Screen    Last Alcohol  Screening Score (AUDIT): 0  Housing: Unknown (01/21/2024)   Epic    Unable to Pay for Housing in the Last Year: No    Number of Times Moved in the Last Year: Not on file    Homeless in the Last Year: No  Utilities: Not At Risk (01/21/2024)   Epic    Threatened with loss of utilities: No  Health Literacy: Adequate Health Literacy (01/21/2024)   B1300 Health Literacy     Frequency of need for help with medical instructions: Never   Family History  Problem Relation Age of Onset   Hyperlipidemia Mother    Hypertension Mother    Stroke Mother    Heart disease Mother    Macular degeneration Mother    Cancer Father        lung cancer   Cancer Sister        lung cancer   Colon cancer Neg Hx    Colon polyps Neg Hx    Esophageal cancer Neg Hx    Rectal cancer Neg Hx    Stomach cancer Neg Hx    Inflammatory bowel disease Neg Hx    Liver disease Neg Hx    Pancreatic cancer Neg Hx    I have reviewed her medical, social, and family history in detail and updated the electronic medical record as necessary.    PHYSICAL EXAMINATION  BP 126/64   Pulse 73   Ht 5' 2.25 (1.581 m)   Wt 149 lb 4 oz (67.7 kg)   BMI 27.08 kg/m  Wt Readings from Last 3 Encounters:  02/23/24 149 lb 4 oz (67.7 kg)  01/21/24 145 lb (65.8 kg)  12/03/23 156 lb (70.8 kg)   GEN: NAD, appears stated age, doesn't appear chronically ill PSYCH: Cooperative, without pressured speech EYE: Conjunctivae pink, sclerae anicteric ENT: MMM CV: Nontachycardic RESP: No audible wheezing GI: NABS, soft, NT/ND, without rebound MSK/EXT: No significant lower extremity edema SKIN: No jaundice NEURO:  Alert & Oriented x 3, no focal deficits   REVIEW OF DATA  I reviewed the following data at the time of this encounter:  GI Procedures and Studies  November 2022 EGD - No gross mucosal lesions in esophagus. Tortuosity noted throughout. Esophagus biopsied. Dilated. - Z-line regular, 31 cm from the incisors. - 9 cm hiatal hernia. - Gastritis. Biopsied. - No gross lesions in the duodenal bulb, in the first portion of the duodenum and in the second portion of the duodenum.  Pathology Diagnosis 1. Surgical [P], gastric antrum and gastric body - REACTIVE GASTROPATHY WITH INTESTINAL METAPLASIA - NO H. PYLORI IDENTIFIED  - SEE COMMENT 2. Surgical [P], distal esophagus - SQUAMOUS AND GLANDULAR  EPITHELIUM WITH ACUTE AND CHRONIC  INFLAMMATION - INTESTINAL METAPLASIA PRESENT - NO DYSPLASIA OR MALIGNANCY IDENTIFIED - SEE COMMENT Microscopic Comment 1. A Warthin-Starry stain is NEGATIVE for organisms morphologically consistent with Helicobacter pylori. 2. With the proper endoscopic findings this is diagnostic of Barrett esophagus.  Laboratory Studies  Reviewed those in epic  Imaging Studies  No new relevant studies to review   ASSESSMENT/PLAN  Ms. Mohammad is a 74 y.o. female.  The patient is seen today for evaluation and management of:  1. Esophageal dysphagia   2. Barrett's esophagus without dysplasia   3. Gastric intestinal metaplasia   4. Colon cancer screening    The patient is hemodynamically stable.  Clinically there may be room for further improvement.   Barrett's esophagus Chronic, non-dysplastic Barrett's esophagus with low risk of progression to esophageal adenocarcinoma but surveillance is recommended.  Not been on PPI but rather just diet for low acid.  Discussed utility and will consider this further after repeat EGD for surveillance. - Scheduled upper endoscopy for Barrett's surveillance.  Gastric intestinal metaplasia Chronic premalignant gastric mucosal change with increased risk for gastric carcinoma, requiring periodic surveillance.  Currently not on any acid suppression.  Discussed utility of PPI therapy.  Further discussions after EGD mapping. - Scheduled upper endoscopy with mapping biopsies of the stomach for surveillance of intestinal metaplasia.  Esophageal dysphagia Intermittent, mild-to-moderate esophageal dysphagia.  Want to ensure nothing has happened on Barrett's and rule out stricture.  Dilation to be performed.  If still having symptoms may be related to hiatal hernia or dysmotility.  Symptoms have been progressive. - Scheduled upper endoscopy to evaluate for structural causes and to perform esophageal dilation if indicated. - Discussed  possible need for esophageal manometry if symptoms persist after dilation.   Colon cancer screening Colon cancer screening due 2027 further discussions at that time based on age whether to continue screening as she is average risk   Orders Placed This Encounter  Procedures   Ambulatory referral to Gastroenterology    New Prescriptions   No medications on file   Modified Medications   No medications on file    Planned Follow Up No follow-ups on file.   Total Time in Face-to-Face and in Coordination of Care for patient including independent/personal interpretation/review of prior testing, medical history, examination, medication adjustment, communicating results with the patient directly, and documentation within the EHR is 45 minutes.   Aloha Finner, MD [facility] Advanced Endoscopy Office # [phone]     [1]  Allergies Allergen Reactions   Sulfa Antibiotics Other (See Comments)    Gave funny feeling   "

## 2024-02-24 ENCOUNTER — Encounter: Payer: Self-pay | Admitting: Gastroenterology

## 2024-02-24 DIAGNOSIS — K22719 Barrett's esophagus with dysplasia, unspecified: Secondary | ICD-10-CM | POA: Insufficient documentation

## 2024-02-24 DIAGNOSIS — K31A0 Gastric intestinal metaplasia, unspecified: Secondary | ICD-10-CM | POA: Insufficient documentation

## 2024-02-24 DIAGNOSIS — R1310 Dysphagia, unspecified: Secondary | ICD-10-CM | POA: Insufficient documentation

## 2024-02-24 DIAGNOSIS — Z1211 Encounter for screening for malignant neoplasm of colon: Secondary | ICD-10-CM | POA: Insufficient documentation

## 2024-02-25 ENCOUNTER — Ambulatory Visit: Admitting: Gastroenterology

## 2024-02-25 ENCOUNTER — Encounter: Payer: Self-pay | Admitting: Gastroenterology

## 2024-02-25 VITALS — BP 112/76 | HR 63 | Temp 97.5°F | Resp 13 | Ht 62.25 in | Wt 149.0 lb

## 2024-02-25 DIAGNOSIS — K319 Disease of stomach and duodenum, unspecified: Secondary | ICD-10-CM

## 2024-02-25 DIAGNOSIS — K2270 Barrett's esophagus without dysplasia: Secondary | ICD-10-CM

## 2024-02-25 DIAGNOSIS — K449 Diaphragmatic hernia without obstruction or gangrene: Secondary | ICD-10-CM

## 2024-02-25 DIAGNOSIS — K31A19 Gastric intestinal metaplasia without dysplasia, unspecified site: Secondary | ICD-10-CM | POA: Diagnosis not present

## 2024-02-25 DIAGNOSIS — K2950 Unspecified chronic gastritis without bleeding: Secondary | ICD-10-CM | POA: Diagnosis present

## 2024-02-25 DIAGNOSIS — K31A0 Gastric intestinal metaplasia, unspecified: Secondary | ICD-10-CM

## 2024-02-25 DIAGNOSIS — Q399 Congenital malformation of esophagus, unspecified: Secondary | ICD-10-CM | POA: Diagnosis not present

## 2024-02-25 DIAGNOSIS — R1319 Other dysphagia: Secondary | ICD-10-CM

## 2024-02-25 MED ORDER — SODIUM CHLORIDE 0.9 % IV SOLN: 500.0000 mL | Freq: Once | INTRAVENOUS | Status: AC

## 2024-02-25 NOTE — Op Note (Signed)
 [facility] Patient Name: Jade Williams Procedure Date: 02/25/2024 9:38 AM MRN: [id number] Endoscopist: Aloha Finner , MD, [id number] Age: 74 Referring MD:  Date of Birth: [birth year] Gender: Female Account #: [id number] Procedure:                Upper GI endoscopy Indications:              Dysphagia, Surveillance for malignancy due to                            personal history of Barrett's esophagus, Intestinal                            metaplasia Medicines:                Monitored Anesthesia Care Procedure:                Pre-Anesthesia Assessment:                           - Prior to the procedure, a History and Physical                            was performed, and patient medications and                            allergies were reviewed. The patient's tolerance of                            previous anesthesia was also reviewed. The risks                            and benefits of the procedure and the sedation                            options and risks were discussed with the patient.                            All questions were answered, and informed consent                            was obtained. Prior Anticoagulants: The patient has                            taken no anticoagulant or antiplatelet agents. ASA                            Grade Assessment: II - A patient with mild systemic                            disease. After reviewing the risks and benefits,                            the patient was deemed in satisfactory condition to  undergo the procedure.                           After obtaining informed consent, the endoscope was                            passed under direct vision. Throughout the                            procedure, the patient's blood pressure, pulse, and                            oxygen saturations were monitored continuously. The                            GIF [id number] #[id number] was introduced  through the                            mouth, and advanced to the second part of duodenum.                            The upper GI endoscopy was accomplished without                            difficulty. The patient tolerated the procedure. Scope In: Scope Out: Findings:                 The examined esophagus was moderately tortuous.                           No gross lesions were noted in the entire                            esophagus. After the rest of the EGD was completed,                            a guidewire was placed and the scope was withdrawn.                            Dilation was performed with a Savary dilator with                            mild resistance at 18 mm. The dilation site was                            examined following endoscope reinsertion and showed                            no change.                           The Z-line was irregular and was found 31 cm from  the incisors. Biopsies were taken with a cold                            forceps for histology due to prior sampling showing                            Barrett's (though not clearly delineated today).                           A 7 cm hiatal hernia was present. This is both                            sliding and paraesophageal.                           Patchy granular mucosa was found in the entire                            examined stomach. With history of GIM, mapping                            biopsies performed. Biopsies were taken with a cold                            forceps for histology from the cardia/fundus.                            Biopsies were taken with a cold forceps for                            histology from the greater/lesser curve of body.                            Biopsies were taken with a cold forceps for                            histology from the antrum/incisura.                           No gross lesions were noted in the duodenal bulb,                             in the first portion of the duodenum and in the                            second portion of the duodenum. Complications:            No immediate complications. Estimated Blood Loss:     Estimated blood loss was minimal. Impression:               - Tortuous esophagus moderate.                           - No gross lesions in the entire esophagus. Dilated  to 18 mm Savary.                           - Z-line irregular, 31 cm from the incisors.                            Biopsied.                           - 7 cm hiatal hernia. Sliding & Paraesophageal.                           - Granular gastric mucosa. Gastric mapping                            performed with history of GIM.                           - No gross lesions in the duodenal bulb, in the                            first portion of the duodenum and in the second                            portion of the duodenum. Recommendation:           - The patient will be observed post-procedure,                            until all discharge criteria are met.                           - Discharge patient to home.                           - Patient has a contact number available for                            emergencies. The signs and symptoms of potential                            delayed complications were discussed with the                            patient. Return to normal activities tomorrow.                            Written discharge instructions were provided to the                            patient.                           - Resume previous diet.                           -  Continue present medications.                           - Await pathology results.                           - If dysphagia persists, can proceed with                            manometry, but suspect that [facility] is playing some role                            with her symptoms.                            - Consider adding back a PPI once daily.                           - Surveillance likely in 3-years for GIM.                           - The findings and recommendations were discussed                            with the patient.                           - The findings and recommendations were discussed                            with the designated responsible adult. Aloha Finner, MD 02/25/2024 10:16:02 AM

## 2024-02-25 NOTE — Progress Notes (Signed)
 Called to room to assist during endoscopic procedure.  Patient ID and intended procedure confirmed with present staff. Received instructions for my participation in the procedure from the performing physician.

## 2024-02-25 NOTE — Progress Notes (Signed)
 "  GASTROENTEROLOGY PROCEDURE H&P NOTE   Primary Care Physician: Nedra Tinnie LABOR, NP  HPI: Jade Williams is a 74 y.o. female who presents for EGD for dysphagia and Barrett's and GIM.  Past Medical History:  Diagnosis Date   Anxiety    Back pain    Barrett's esophagus 01/17/2021   Depression    GERD (gastroesophageal reflux disease)    Hyperlipidemia    Hypertension    Osteopenia 11/14/2019   T score -1.6   Pernicious anemia    Past Surgical History:  Procedure Laterality Date   APPENDECTOMY     HEMORRHOID BANDING     TONSILLECTOMY     WISDOM TOOTH EXTRACTION     Current Outpatient Medications  Medication Sig Dispense Refill   albuterol  (VENTOLIN  HFA) 108 (90 Base) MCG/ACT inhaler Inhale 2 puffs into the lungs every 6 (six) hours as needed for wheezing or shortness of breath. 18 g 1   ascorbic acid (VITAMIN C) 500 MG tablet Take 500 mg by mouth daily.     atorvastatin  (LIPITOR) 10 MG tablet Take 1 tablet (10 mg total) by mouth daily. 90 tablet 3   b complex vitamins capsule Take 1 capsule by mouth daily.     benzonatate  (TESSALON ) 100 MG capsule Take 1 capsule (100 mg total) by mouth 3 (three) times daily as needed for cough. 30 capsule 0   Bioflavonoid Products (VITAMIN C) CHEW Chew by mouth.     calcium  carbonate (OS-CAL - DOSED IN MG OF ELEMENTAL CALCIUM ) 1250 (500 Ca) MG tablet Take 2 tablets by mouth daily.     cholecalciferol (VITAMIN D) 1000 UNITS tablet Take 5,000 Units by mouth daily.      Coenzyme Q10 (CO Q 10 PO) Take by mouth.     COLLAGEN MATRIX, BOVINE, EX Apply topically.     cyanocobalamin  (VITAMIN B12) 1000 MCG/ML injection INJECT 1 ML INTRAMUSCULARLY EVERY 30 DAYS 3 mL 3   Docosahexaenoic Acid (DHA OMEGA 3 PO) Take 1 capsule by mouth daily.     fluconazole  (DIFLUCAN ) 150 MG tablet Take 150 mg by mouth once. (Patient not taking: Reported on 02/23/2024)     loratadine (CLARITIN) 10 MG tablet Take 10 mg by mouth as needed.     MAGNESIUM CARBONATE PO  Take by mouth.     Multiple Vitamins-Minerals (ICAPS AREDS 2 PO) Take 1 capsule by mouth daily.     NON FORMULARY Vitamin D and K     PSYLLIUM HUSK PO Take by mouth.     sertraline  (ZOLOFT ) 100 MG tablet TAKE 1 TABLET BY MOUTH EVERY DAY 90 tablet 3   valsartan  (DIOVAN ) 160 MG tablet TAKE 1 TABLET BY MOUTH EVERY DAY 90 tablet 3   vitamin E 400 UNIT capsule Take 400 Units by mouth daily.     No current facility-administered medications for this visit.   Current Medications[1] Allergies[2] Family History  Problem Relation Age of Onset   Hyperlipidemia Mother    Hypertension Mother    Stroke Mother    Heart disease Mother    Macular degeneration Mother    Cancer Father        lung cancer   Cancer Sister        lung cancer   Colon cancer Neg Hx    Colon polyps Neg Hx    Esophageal cancer Neg Hx    Rectal cancer Neg Hx    Stomach cancer Neg Hx    Inflammatory bowel disease Neg Hx  Liver disease Neg Hx    Pancreatic cancer Neg Hx    Social History   Socioeconomic History   Marital status: Single    Spouse name: Not on file   Number of children: Not on file   Years of education: Not on file   Highest education level: Bachelor's degree (e.g., BA, AB, BS)  Occupational History   Not on file  Tobacco Use   Smoking status: Never   Smokeless tobacco: Never  Vaping Use   Vaping status: Never Used  Substance and Sexual Activity   Alcohol  use: Not Currently   Drug use: No   Sexual activity: Not on file  Other Topics Concern   Not on file  Social History Narrative   Not on file   Social Drivers of Health   Tobacco Use: Low Risk (02/24/2024)   Patient History    Smoking Tobacco Use: Never    Smokeless Tobacco Use: Never    Passive Exposure: Not on file  Financial Resource Strain: Low Risk (01/21/2024)   Overall Financial Resource Strain (CARDIA)    Difficulty of Paying Living Expenses: Not hard at all  Food Insecurity: No Food Insecurity (01/21/2024)   Epic    Worried  About Radiation Protection Practitioner of Food in the Last Year: Never true    Ran Out of Food in the Last Year: Never true  Transportation Needs: No Transportation Needs (01/21/2024)   Epic    Lack of Transportation (Medical): No    Lack of Transportation (Non-Medical): No  Physical Activity: Sufficiently Active (01/21/2024)   Exercise Vital Sign    Days of Exercise per Week: 7 days    Minutes of Exercise per Session: 30 min  Stress: No Stress Concern Present (01/21/2024)   [employer] of Occupational Health - Occupational Stress Questionnaire    Feeling of Stress: Not at all  Social Connections: Moderately Isolated (01/21/2024)   Social Connection and Isolation Panel    Frequency of Communication with Friends and Family: More than three times a week    Frequency of Social Gatherings with Friends and Family: More than three times a week    Attends Religious Services: Never    Database Administrator or Organizations: Yes    Attends Engineer, Structural: More than 4 times per year    Marital Status: Never married  Intimate Partner Violence: Not At Risk (01/21/2024)   Epic    Fear of Current or Ex-Partner: No    Emotionally Abused: No    Physically Abused: No    Sexually Abused: No  Depression (PHQ2-9): Low Risk (01/21/2024)   Depression (PHQ2-9)    PHQ-2 Score: 0  Alcohol  Screen: Low Risk (01/21/2024)   Alcohol  Screen    Last Alcohol  Screening Score (AUDIT): 0  Housing: Unknown (01/21/2024)   Epic    Unable to Pay for Housing in the Last Year: No    Number of Times Moved in the Last Year: Not on file    Homeless in the Last Year: No  Utilities: Not At Risk (01/21/2024)   Epic    Threatened with loss of utilities: No  Health Literacy: Adequate Health Literacy (01/21/2024)   B1300 Health Literacy    Frequency of need for help with medical instructions: Never    Physical Exam: There were no vitals filed for this visit. There is no height or weight on file to calculate BMI. GEN: NAD EYE:  Sclerae anicteric ENT: MMM CV: Non-tachycardic GI: Soft, NT/ND NEURO:  Alert & Oriented x 3  Lab Results: No results for input(s): WBC, HGB, HCT, PLT in the last 72 hours. BMET No results for input(s): NA, K, CL, CO2, GLUCOSE, BUN, CREATININE, CALCIUM  in the last 72 hours. LFT No results for input(s): PROT, ALBUMIN, AST, ALT, ALKPHOS, BILITOT, BILIDIR, IBILI in the last 72 hours. PT/INR No results for input(s): LABPROT, INR in the last 72 hours.   Impression / Plan: This is a 74 y.o.female who presents for EGD for dysphagia and Barrett's and GIM.  The risks and benefits of endoscopic evaluation/treatment were discussed with the patient and/or family; these include but are not limited to the risk of perforation, infection, bleeding, missed lesions, lack of diagnosis, severe illness requiring hospitalization, as well as anesthesia and sedation related illnesses.  The patient's history has been reviewed, patient examined, no change in status, and deemed stable for procedure.  The patient and/or family was provided an opportunity to ask questions and all were answered.  The patient and/or family is agreeable to proceed.    Aloha Finner, MD [facility] Office # [phone]     [1]  Current Outpatient Medications:    albuterol  (VENTOLIN  HFA) 108 (90 Base) MCG/ACT inhaler, Inhale 2 puffs into the lungs every 6 (six) hours as needed for wheezing or shortness of breath., Disp: 18 g, Rfl: 1   ascorbic acid (VITAMIN C) 500 MG tablet, Take 500 mg by mouth daily., Disp: , Rfl:    atorvastatin  (LIPITOR) 10 MG tablet, Take 1 tablet (10 mg total) by mouth daily., Disp: 90 tablet, Rfl: 3   b complex vitamins capsule, Take 1 capsule by mouth daily., Disp: , Rfl:    benzonatate  (TESSALON ) 100 MG capsule, Take 1 capsule (100 mg total) by mouth 3 (three) times daily as needed for cough., Disp: 30 capsule, Rfl: 0    Bioflavonoid Products (VITAMIN C) CHEW, Chew by mouth., Disp: , Rfl:    calcium  carbonate (OS-CAL - DOSED IN MG OF ELEMENTAL CALCIUM ) 1250 (500 Ca) MG tablet, Take 2 tablets by mouth daily., Disp: , Rfl:    cholecalciferol (VITAMIN D) 1000 UNITS tablet, Take 5,000 Units by mouth daily. , Disp: , Rfl:    Coenzyme Q10 (CO Q 10 PO), Take by mouth., Disp: , Rfl:    COLLAGEN MATRIX, BOVINE, EX, Apply topically., Disp: , Rfl:    cyanocobalamin  (VITAMIN B12) 1000 MCG/ML injection, INJECT 1 ML INTRAMUSCULARLY EVERY 30 DAYS, Disp: 3 mL, Rfl: 3   Docosahexaenoic Acid (DHA OMEGA 3 PO), Take 1 capsule by mouth daily., Disp: , Rfl:    fluconazole  (DIFLUCAN ) 150 MG tablet, Take 150 mg by mouth once. (Patient not taking: Reported on 02/23/2024), Disp: , Rfl:    loratadine (CLARITIN) 10 MG tablet, Take 10 mg by mouth as needed., Disp: , Rfl:    MAGNESIUM CARBONATE PO, Take by mouth., Disp: , Rfl:    Multiple Vitamins-Minerals (ICAPS AREDS 2 PO), Take 1 capsule by mouth daily., Disp: , Rfl:    NON FORMULARY, Vitamin D and K, Disp: , Rfl:    PSYLLIUM HUSK PO, Take by mouth., Disp: , Rfl:    sertraline  (ZOLOFT ) 100 MG tablet, TAKE 1 TABLET BY MOUTH EVERY DAY, Disp: 90 tablet, Rfl: 3   valsartan  (DIOVAN ) 160 MG tablet, TAKE 1 TABLET BY MOUTH EVERY DAY, Disp: 90 tablet, Rfl: 3   vitamin E 400 UNIT capsule, Take 400 Units by mouth daily., Disp: , Rfl:  [2]  Allergies Allergen Reactions  Sulfa Antibiotics Other (See Comments)    Gave funny feeling   "

## 2024-02-25 NOTE — Progress Notes (Signed)
 Report to PACU, RN, vss, BBS= Clear.

## 2024-02-25 NOTE — Patient Instructions (Signed)
 YOU HAD AN ENDOSCOPIC PROCEDURE TODAY AT THE [facility]:   Refer to the procedure report that was given to you for any specific questions about what was found during the examination.  If the procedure report does not answer your questions, please call your gastroenterologist to clarify.  If you requested that your care partner not be given the details of your procedure findings, then the procedure report has been included in a sealed envelope for you to review at your convenience later.  YOU SHOULD EXPECT: Some feelings of bloating in the abdomen. Passage of more gas than usual.  Walking can help get rid of the air that was put into your GI tract during the procedure and reduce the bloating. If you had a lower endoscopy (such as a colonoscopy or flexible sigmoidoscopy) you may notice spotting of blood in your stool or on the toilet paper. If you underwent a bowel prep for your procedure, you may not have a normal bowel movement for a few days.  Please Note:  You might notice some irritation and congestion in your nose or some drainage.  This is from the oxygen used during your procedure.  There is no need for concern and it should clear up in a day or so.  SYMPTOMS TO REPORT IMMEDIATELY:   Following upper endoscopy (EGD)  Vomiting of blood or coffee ground material  New chest pain or pain under the shoulder blades  Painful or persistently difficult swallowing  New shortness of breath  Fever of 100F or higher  Black, tarry-looking stools  For urgent or emergent issues, a gastroenterologist can be reached at any hour by calling [phone]. Do not use MyChart messaging for urgent concerns.    DIET:  We do recommend a small meal at first, but then you may proceed to your regular diet.  Drink plenty of fluids but you should avoid alcoholic beverages for 24 hours.  MEDICATIONS: Continue present medications.   FOLLOW UP: Await pathology results. If dysphagia persists, can proceed  with manometry, but suspect that the hiatal hernia is playing some role with her symptoms. Surveillance likely in 3 years  for GIM. Consider adding back a PPI once daily.  Educational handouts given to patient: Hiatal Hernia.  Thank you for allowing us  to provide for your healthcare needs today.  ACTIVITY:  You should plan to take it easy for the rest of today and you should NOT DRIVE or use heavy machinery until tomorrow (because of the sedation medicines used during the test).    FOLLOW UP: Our staff will call the number listed on your records the next business day following your procedure.  We will call around 7:15- 8:00 am to check on you and address any questions or concerns that you may have regarding the information given to you following your procedure. If we do not reach you, we will leave a message.     If any biopsies were taken you will be contacted by phone or by letter within the next 1-3 weeks.  Please call us  at [phone] if you have not heard about the biopsies in 3 weeks.    SIGNATURES/CONFIDENTIALITY: You and/or your care partner have signed paperwork which will be entered into your electronic medical record.  These signatures attest to the fact that that the information above on your After Visit Summary has been reviewed and is understood.  Full responsibility of the confidentiality of this discharge information lies with you and/or your care-partner.

## 2024-02-28 ENCOUNTER — Telehealth: Payer: Self-pay | Admitting: *Deleted

## 2024-02-28 NOTE — Telephone Encounter (Signed)
" °  Follow up Call-     02/25/2024    9:28 AM  Call back number  Post procedure Call Back phone  # [phone]  Permission to leave phone message Yes    Post procedure follow up phone call. No answer at number given.  Left message on voicemail.  "

## 2024-03-02 LAB — SURGICAL PATHOLOGY

## 2024-03-10 ENCOUNTER — Ambulatory Visit: Payer: Self-pay | Admitting: Gastroenterology

## 2024-03-21 ENCOUNTER — Ambulatory Visit: Payer: Self-pay

## 2024-03-21 NOTE — Telephone Encounter (Signed)
 FYI Only or Action Required?: FYI only for provider: appointment scheduled on 03/22/24.  Patient was last seen in primary care on 12/17/2023 by Nedra Tinnie LABOR, NP.  Called Nurse Triage reporting Leg Pain.  Symptoms began several days ago.  Interventions attempted: OTC medications: Aspirin, Rest, hydration, or home remedies, and Ice/heat application.  Symptoms are: stable.  Triage Disposition: See PCP When Office is Open (Within 3 Days)  Patient/caregiver understands and will follow disposition?: Yes   Reason for Disposition  [1] MODERATE pain (e.g., interferes with normal activities, limping) AND [2] present > 3 days  Answer Assessment - Initial Assessment Questions Pt doing RICE, taking Aspirin.   1. ONSET: When did the pain start?      5 days ago  2. LOCATION: Where is the pain located?      Left leg, behind the knee and going down, left ankle pain  3. PAIN: How bad is the pain?    (Scale 1-10; or mild, moderate, severe)     5-6/10, describes it as a dull pain  4. CAUSE: What do you think is causing the leg pain?     Fell on ice  5. OTHER SYMPTOMS: Do you have any other symptoms? (e.g., chest pain, back pain, breathing difficulty, swelling, rash, fever, numbness, weakness)     Bruising on the knee, mild swelling  Protocols used: Leg Pain-A-AH  Message from Rea ORN sent at 03/21/2024 11:57 AM EST  Reason for Triage: Fall on ice last Thursday, left leg pain

## 2024-03-21 NOTE — Telephone Encounter (Signed)
 Noted. Appointment scheduled for 03/22/2024.

## 2024-03-22 ENCOUNTER — Encounter: Payer: Self-pay | Admitting: Nurse Practitioner

## 2024-03-22 ENCOUNTER — Ambulatory Visit: Payer: Self-pay | Admitting: Nurse Practitioner

## 2024-03-22 ENCOUNTER — Ambulatory Visit (INDEPENDENT_AMBULATORY_CARE_PROVIDER_SITE_OTHER)

## 2024-03-22 ENCOUNTER — Ambulatory Visit: Admitting: Physician Assistant

## 2024-03-22 ENCOUNTER — Ambulatory Visit

## 2024-03-22 ENCOUNTER — Ambulatory Visit: Admitting: Nurse Practitioner

## 2024-03-22 VITALS — BP 112/76 | HR 61 | Temp 97.1°F | Ht 62.25 in | Wt 150.6 lb

## 2024-03-22 DIAGNOSIS — Z23 Encounter for immunization: Secondary | ICD-10-CM | POA: Diagnosis not present

## 2024-03-22 DIAGNOSIS — M25562 Pain in left knee: Secondary | ICD-10-CM | POA: Insufficient documentation

## 2024-03-22 DIAGNOSIS — W19XXXA Unspecified fall, initial encounter: Secondary | ICD-10-CM | POA: Diagnosis not present

## 2024-03-22 MED ORDER — IBUPROFEN 200 MG PO TABS
400.0000 mg | ORAL_TABLET | Freq: Once | ORAL | Status: AC
  Administered 2024-03-22: 400 mg via ORAL

## 2024-03-22 NOTE — Assessment & Plan Note (Signed)
 Swelling and bruising are present with pain on movement. She should wear a compression knee sleeve for support and apply ice several times daily. Ibuprofen  or aspirin is recommended for inflammation, and Tylenol for pain relief. She is referred to orthopedics for evaluation, and a knee x-ray is ordered to rule out a fracture. Urgent orthopedic care at [facility] is considered if necessary. Will give ibuprofen  400mg  oral in clinic today.

## 2024-03-22 NOTE — Progress Notes (Signed)
 "  Acute Visit  BP 112/76 (BP Location: Left Arm, Patient Position: Sitting, Cuff Size: Normal)   Pulse 61   Temp (!) 97.1 F (36.2 C)   Ht 5' 2.25 (1.581 m)   Wt 150 lb 9.6 oz (68.3 kg)   SpO2 100%   BMI 27.32 kg/m    Subjective:    Patient ID: Jade Williams, female    DOB: 08-Jan-1951, 74 y.o.   MRN: [id number]  CC: Chief Complaint  Patient presents with   Jade Williams hurting left leg-pain behind left knee and calf and ankle    HPI: Jade Williams is a 74 y.o. female presents for an injury of her left leg, sustained from a fall.   She reports that pain onset 5 days ago after falling on ice, and is affecting her left leg, specifically behind the knee and diffusing down to her left ankle. She described this pain as a 5-6/10, dull pain. She additionally has some bruising and mild swelling of her left knee.  Pain worsens with movement but she can still walk. She has used ice, aspirin, and a compression sock. Pain is generally unchanged, with some temporary relief after aspirin. She denies back or buttock pain despite also landing in those areas. She reports her knee felt unstable at times before the fall.    Past Medical History:  Diagnosis Date   Anxiety    Arthritis    Back pain    Barrett's esophagus 01/17/2021   Cataract    Depression    GERD (gastroesophageal reflux disease)    Heart murmur    Hyperlipidemia    Hypertension    Osteopenia 11/14/2019   T score -1.6   Osteopenia    Pernicious anemia     Past Surgical History:  Procedure Laterality Date   APPENDECTOMY     COLONOSCOPY     HEMORRHOID BANDING     TONSILLECTOMY     UPPER GASTROINTESTINAL ENDOSCOPY     WISDOM TOOTH EXTRACTION      Family History  Problem Relation Age of Onset   Hyperlipidemia Mother    Hypertension Mother    Stroke Mother    Heart disease Mother    Macular degeneration Mother    Cancer Father        lung cancer   Cancer Sister        lung cancer   Colon cancer  Neg Hx    Colon polyps Neg Hx    Esophageal cancer Neg Hx    Rectal cancer Neg Hx    Stomach cancer Neg Hx    Inflammatory bowel disease Neg Hx    Liver disease Neg Hx    Pancreatic cancer Neg Hx      Social History[1]  Medications Ordered Prior to Encounter[2]   Review of Systems See pertinent positives and negatives per HPI.     Objective:    BP 112/76 (BP Location: Left Arm, Patient Position: Sitting, Cuff Size: Normal)   Pulse 61   Temp (!) 97.1 F (36.2 C)   Ht 5' 2.25 (1.581 m)   Wt 150 lb 9.6 oz (68.3 kg)   SpO2 100%   BMI 27.32 kg/m   Wt Readings from Last 3 Encounters:  03/22/24 150 lb 9.6 oz (68.3 kg)  02/25/24 149 lb (67.6 kg)  02/23/24 149 lb 4 oz (67.7 kg)    BP Readings from Last 3 Encounters:  03/22/24 112/76  02/25/24 112/76  02/23/24 126/64  Physical Exam Vitals and nursing note reviewed.  Constitutional:      General: She is not in acute distress.    Appearance: Normal appearance.  HENT:     Head: Normocephalic.  Eyes:     Conjunctiva/sclera: Conjunctivae normal.  Pulmonary:     Effort: Pulmonary effort is normal.  Musculoskeletal:        General: Swelling (left knee) and tenderness (left posterior knee) present. Normal range of motion.     Cervical back: Normal range of motion.  Skin:    General: Skin is warm.     Findings: Bruising (left anterior knee) present.  Neurological:     General: No focal deficit present.     Mental Status: She is alert and oriented to person, place, and time.  Psychiatric:        Mood and Affect: Mood normal.        Behavior: Behavior normal.        Thought Content: Thought content normal.        Judgment: Judgment normal.        Assessment & Plan:   Problem List Items Addressed This Visit       Other   Acute pain of left knee - Primary   Swelling and bruising are present with pain on movement. She should wear a compression knee sleeve for support and apply ice several times daily. Ibuprofen   or aspirin is recommended for inflammation, and Tylenol for pain relief. She is referred to orthopedics for evaluation, and a knee x-ray is ordered to rule out a fracture. Urgent orthopedic care at [facility] is considered if necessary. Will give ibuprofen  400mg  oral in clinic today.       Relevant Medications   ibuprofen  (ADVIL ) tablet 400 mg (Start on 03/22/2024  9:30 AM)   Other Relevant Orders   Ambulatory referral to Orthopedic Surgery   DG Knee Complete 4 Views Left   Other Visit Diagnoses       Fall, initial encounter       Slipped on ice, twisted knee and landed on buttocks. No back or buttock pain. She does have pain and bruising to left knee. See plan above.   Relevant Orders   Ambulatory referral to Orthopedic Surgery   DG Knee Complete 4 Views Left     Immunization due       Prevnar 20 given today   Relevant Orders   Pneumococcal conjugate vaccine 20-valent (Completed)        Follow up plan: Return if symptoms worsen or fail to improve.  Tinnie DELENA Harada, NP  I,Emily Lagle,acting as a scribe for [employer], NP.,have documented all relevant documentation on the behalf of Jade Williams DELENA Harada, NP.  I, Tinnie DELENA Harada, NP, have reviewed all documentation for this visit. The documentation on 03/22/2024 for the exam, diagnosis, procedures, and orders are all accurate and complete.     [1]  Social History Tobacco Use   Smoking status: Never   Smokeless tobacco: Never  Vaping Use   Vaping status: Never Used  Substance Use Topics   Alcohol  use: Not Currently   Drug use: No  [2]  Current Outpatient Medications on File Prior to Visit  Medication Sig Dispense Refill   aspirin 325 MG tablet Take 325 mg by mouth daily.     albuterol  (VENTOLIN  HFA) 108 (90 Base) MCG/ACT inhaler Inhale 2 puffs into the lungs every 6 (six) hours as needed for wheezing or shortness of breath. 18  g 1   ascorbic acid (VITAMIN C) 500 MG tablet Take 500 mg by mouth daily.     atorvastatin   (LIPITOR) 10 MG tablet Take 1 tablet (10 mg total) by mouth daily. 90 tablet 3   b complex vitamins capsule Take 1 capsule by mouth daily.     benzonatate  (TESSALON ) 100 MG capsule Take 1 capsule (100 mg total) by mouth 3 (three) times daily as needed for cough. 30 capsule 0   Bioflavonoid Products (VITAMIN C) CHEW Chew by mouth.     calcium  carbonate (OS-CAL - DOSED IN MG OF ELEMENTAL CALCIUM ) 1250 (500 Ca) MG tablet Take 2 tablets by mouth daily.     cholecalciferol (VITAMIN D) 1000 UNITS tablet Take 5,000 Units by mouth daily.      Coenzyme Q10 (CO Q 10 PO) Take by mouth.     COLLAGEN MATRIX, BOVINE, EX Apply topically.     cyanocobalamin  (VITAMIN B12) 1000 MCG/ML injection INJECT 1 ML INTRAMUSCULARLY EVERY 30 DAYS 3 mL 3   Docosahexaenoic Acid (DHA OMEGA 3 PO) Take 1 capsule by mouth daily.     fluconazole  (DIFLUCAN ) 150 MG tablet Take 150 mg by mouth once. (Patient not taking: Reported on 02/23/2024)     loratadine (CLARITIN) 10 MG tablet Take 10 mg by mouth as needed.     MAGNESIUM CARBONATE PO Take by mouth.     Multiple Vitamins-Minerals (ICAPS AREDS 2 PO) Take 1 capsule by mouth daily.     NON FORMULARY Vitamin D and K     Probiotic Product (PROBIOTIC PO) Take by mouth daily.     PSYLLIUM HUSK PO Take by mouth.     sertraline  (ZOLOFT ) 100 MG tablet TAKE 1 TABLET BY MOUTH EVERY DAY 90 tablet 3   valsartan  (DIOVAN ) 160 MG tablet TAKE 1 TABLET BY MOUTH EVERY DAY 90 tablet 3   vitamin E 400 UNIT capsule Take 400 Units by mouth daily.     Current Facility-Administered Medications on File Prior to Visit  Medication Dose Route Frequency Provider Last Rate Last Admin   0.9 %  sodium chloride  infusion  500 mL Intravenous Once Mansouraty, Gabriel Jr., MD       "

## 2024-03-22 NOTE — Patient Instructions (Signed)
 It was great to see you!  We will get an xray today  I have placed a referral to ortho - they will call to schedule   If you need to be seen sooner, [facility] has a walk in urgent ortho care center  Get a knee compression sleeve to wear during the day   Start ice a few times a day   You can take tylenol with ibuprofen  or aspirin as needed for pain  Let's follow-up if symptoms worsen or any concern  Take care,  Tinnie Harada, NP

## 2024-03-23 ENCOUNTER — Ambulatory Visit: Admitting: Orthopedic Surgery

## 2024-03-23 ENCOUNTER — Encounter: Payer: Self-pay | Admitting: Orthopedic Surgery

## 2024-03-23 DIAGNOSIS — M1712 Unilateral primary osteoarthritis, left knee: Secondary | ICD-10-CM

## 2024-03-23 DIAGNOSIS — S93402A Sprain of unspecified ligament of left ankle, initial encounter: Secondary | ICD-10-CM

## 2024-03-23 DIAGNOSIS — S86112A Strain of other muscle(s) and tendon(s) of posterior muscle group at lower leg level, left leg, initial encounter: Secondary | ICD-10-CM

## 2024-03-23 NOTE — Progress Notes (Signed)
 "  Office Visit Note   Patient: Jade Williams           Date of Birth: Oct 07, 1950           MRN: [id number] Visit Date: 03/23/2024              Requested by: Nedra Tinnie LABOR, NP [address] [place],  [place] [address] PCP: Nedra Tinnie LABOR, NP  Chief Complaint  Patient presents with   Left Knee - Pain      HPI: Discussed the use of AI scribe software for clinical note transcription with the patient, who gave verbal consent to proceed.  History of Present Illness Jade Williams is a 74 year old female with severe left knee osteoarthritis who presents for evaluation of left lower leg pain and ecchymosis following a recent fall.  She reports acute onset of pain, swelling, and ecchymosis localized to the lateral gastrocnemius region of the left lower leg after a recent twisting injury. She describes the area as tight and painful, with discomfort extending throughout the leg unless she takes ibuprofen . She denies significant swelling or pain in the left knee itself. She has temporarily limited her activity, including walking her dog, due to discomfort, but is interested in resuming activity as tolerated.  Radiographs show severe tricompartmental osteoarthritis of the left knee with valgus alignment, lateral bone-on-bone contact, subchondral sclerosis, and cyst formation, most pronounced at the lateral joint line. She denies significant left knee pain except when rising from the floor and prefers to avoid knee arthroplasty at this time. She is not currently receiving intra-articular injections. She uses a neoprene knee brace for warmth and comfort, but does not perceive structural support. She is not taking anticoagulants and has not used an Ace bandage, but expresses interest in compression therapy for symptom management.     Assessment & Plan: Visit Diagnoses:  1. Sprain, gastrocnemius, left, initial encounter   2. Mild ankle sprain, left, initial encounter   3.  Unilateral primary osteoarthritis, left knee     Plan: Assessment and Plan Assessment & Plan Strain of left lateral gastrocnemius muscle Acute strain with ecchymosis and tenderness, expected to be self-limited. - Recommended NSAIDs for analgesia. - Advised warmth application; ice not indicated. - Recommended massage for circulation. - Advised neoprene brace or Ace bandage for comfort; provided Ace bandage. - Encouraged activity as tolerated, avoiding falls. - Instructed to follow up if symptoms worsen or fail to improve.  Osteoarthritis of left knee with valgus deformity Chronic severe tricompartmental osteoarthritis with valgus alignment, minimally symptomatic. - No immediate intervention required. - Future management may include corticosteroid injections if symptoms worsen. - Total knee arthroplasty considered if pain becomes severe and refractory. - Advised against hyaluronic acid injections. - Neoprene brace may be used for warmth. - Instructed to monitor symptoms and follow up as needed.      Follow-Up Instructions: Return if symptoms worsen or fail to improve.   Ortho Exam  Patient is alert, oriented, no adenopathy, well-dressed, normal affect, normal respiratory effort. Physical Exam EXTREMITIES: Ecchymosis and bruising over the lateral gastrocnemius muscle. Tenderness over the lateral gastrocnemius muscle. Ankle stable and non-tender to palpation. MUSCULOSKELETAL: Left knee full range of motion. No effusion in the left knee. No medial or lateral tenderness in the left knee. Negative anterior drawer sign in the left knee.      Imaging: No results found. No images are attached to the encounter.  Labs: Lab Results  Component Value Date   HGBA1C  5.7 (A) 12/03/2023   HGBA1C 5.7 12/03/2023   HGBA1C 5.7 12/03/2023   HGBA1C 5.7 12/03/2023     Lab Results  Component Value Date   ALBUMIN 4.4 10/28/2023   ALBUMIN 4.1 06/11/2022   ALBUMIN 4.3 08/15/2021   ALBUMIN  4.4 08/15/2021    No results found for: MG No results found for: VD25OH  No results found for: PREALBUMIN    Latest Ref Rng & Units 06/11/2022    8:28 AM 02/05/2021   12:00 AM 10/20/2020    8:32 PM  CBC EXTENDED  WBC 4.0 - 10.5 K/uL 5.8  6.4     10.7   RBC 3.87 - 5.11 Mil/uL 4.95  4.52     4.37   Hemoglobin 12.0 - 15.0 g/dL 85.7  86.6     87.2   HCT 36.0 - 46.0 % 43.3  39     37.9   Platelets 150.0 - 400.0 K/uL 216.0  223     223      This result is from an external source.     There is no height or weight on file to calculate BMI.  Orders:  No orders of the defined types were placed in this encounter.  No orders of the defined types were placed in this encounter.    Procedures: No procedures performed  Clinical Data: No additional findings.  ROS:  All other systems negative, except as noted in the HPI. Review of Systems  Objective: Vital Signs: There were no vitals taken for this visit.  Specialty Comments:  No specialty comments available.  PMFS History: Patient Active Problem List   Diagnosis Date Noted   Acute pain of left knee 03/22/2024   Dysphagia 02/24/2024   Gastric intestinal metaplasia 02/24/2024   Barrett's esophagus with dysplasia 02/24/2024   Colon cancer screening 02/24/2024   Influenza A 12/17/2023   Prediabetes 12/03/2023   Arthritis 12/03/2023   Routine general medical examination at a health care facility 12/03/2023   Lump of skin 09/29/2023   Osteopenia 09/28/2023   Tinnitus of left ear 08/15/2021   Pernicious anemia 04/03/2021   Seasonal allergies 04/02/2021   Anxiety 04/02/2021   Hyperlipidemia 04/02/2021   Primary hypertension 04/02/2021   Gastroesophageal reflux disease 01/08/2021   Large hiatal hernia 01/08/2021   Past Medical History:  Diagnosis Date   Anxiety    Arthritis    Back pain    Barrett's esophagus 01/17/2021   Cataract    Depression    GERD (gastroesophageal reflux disease)    Heart murmur     Hyperlipidemia    Hypertension    Osteopenia 11/14/2019   T score -1.6   Osteopenia    Pernicious anemia     Family History  Problem Relation Age of Onset   Hyperlipidemia Mother    Hypertension Mother    Stroke Mother    Heart disease Mother    Macular degeneration Mother    Cancer Father        lung cancer   Cancer Sister        lung cancer   Colon cancer Neg Hx    Colon polyps Neg Hx    Esophageal cancer Neg Hx    Rectal cancer Neg Hx    Stomach cancer Neg Hx    Inflammatory bowel disease Neg Hx    Liver disease Neg Hx    Pancreatic cancer Neg Hx     Past Surgical History:  Procedure Laterality Date   APPENDECTOMY  COLONOSCOPY     HEMORRHOID BANDING     TONSILLECTOMY     UPPER GASTROINTESTINAL ENDOSCOPY     WISDOM TOOTH EXTRACTION     Social History   Occupational History   Not on file  Tobacco Use   Smoking status: Never   Smokeless tobacco: Never  Vaping Use   Vaping status: Never Used  Substance and Sexual Activity   Alcohol  use: Not Currently   Drug use: No   Sexual activity: Not on file         "
